# Patient Record
Sex: Female | Born: 1935
Health system: Southern US, Community
[De-identification: ages and names within clinical notes are randomized; demographics above are authoritative.]

---

## 2011-01-16 DEATH — deceased

## 2014-08-05 DIAGNOSIS — M75102 Unspecified rotator cuff tear or rupture of left shoulder, not specified as traumatic: Secondary | ICD-10-CM | POA: Diagnosis not present

## 2014-08-05 DIAGNOSIS — M25511 Pain in right shoulder: Secondary | ICD-10-CM | POA: Diagnosis not present

## 2014-08-05 DIAGNOSIS — M47812 Spondylosis without myelopathy or radiculopathy, cervical region: Secondary | ICD-10-CM | POA: Diagnosis not present

## 2014-08-12 DIAGNOSIS — M25512 Pain in left shoulder: Secondary | ICD-10-CM | POA: Diagnosis not present

## 2014-08-12 DIAGNOSIS — M75102 Unspecified rotator cuff tear or rupture of left shoulder, not specified as traumatic: Secondary | ICD-10-CM | POA: Diagnosis not present

## 2014-08-12 DIAGNOSIS — M25612 Stiffness of left shoulder, not elsewhere classified: Secondary | ICD-10-CM | POA: Diagnosis not present

## 2014-08-23 DIAGNOSIS — R0602 Shortness of breath: Secondary | ICD-10-CM | POA: Diagnosis not present

## 2014-08-23 DIAGNOSIS — Z886 Allergy status to analgesic agent status: Secondary | ICD-10-CM | POA: Diagnosis not present

## 2014-08-23 DIAGNOSIS — I129 Hypertensive chronic kidney disease with stage 1 through stage 4 chronic kidney disease, or unspecified chronic kidney disease: Secondary | ICD-10-CM | POA: Diagnosis not present

## 2014-08-23 DIAGNOSIS — R0789 Other chest pain: Secondary | ICD-10-CM | POA: Diagnosis not present

## 2014-08-23 DIAGNOSIS — E782 Mixed hyperlipidemia: Secondary | ICD-10-CM | POA: Diagnosis not present

## 2014-08-23 DIAGNOSIS — Z880 Allergy status to penicillin: Secondary | ICD-10-CM | POA: Diagnosis not present

## 2014-08-23 DIAGNOSIS — Z79899 Other long term (current) drug therapy: Secondary | ICD-10-CM | POA: Diagnosis not present

## 2014-08-23 DIAGNOSIS — E1142 Type 2 diabetes mellitus with diabetic polyneuropathy: Secondary | ICD-10-CM | POA: Diagnosis not present

## 2014-08-23 DIAGNOSIS — Z885 Allergy status to narcotic agent status: Secondary | ICD-10-CM | POA: Diagnosis not present

## 2014-08-23 DIAGNOSIS — K449 Diaphragmatic hernia without obstruction or gangrene: Secondary | ICD-10-CM | POA: Diagnosis not present

## 2014-08-23 DIAGNOSIS — Z888 Allergy status to other drugs, medicaments and biological substances status: Secondary | ICD-10-CM | POA: Diagnosis not present

## 2014-08-23 DIAGNOSIS — I2699 Other pulmonary embolism without acute cor pulmonale: Secondary | ICD-10-CM | POA: Diagnosis not present

## 2014-08-23 DIAGNOSIS — I10 Essential (primary) hypertension: Secondary | ICD-10-CM | POA: Diagnosis not present

## 2014-08-23 DIAGNOSIS — N183 Chronic kidney disease, stage 3 (moderate): Secondary | ICD-10-CM | POA: Diagnosis not present

## 2014-08-23 DIAGNOSIS — Z794 Long term (current) use of insulin: Secondary | ICD-10-CM | POA: Diagnosis not present

## 2014-08-23 DIAGNOSIS — E785 Hyperlipidemia, unspecified: Secondary | ICD-10-CM | POA: Diagnosis not present

## 2014-08-23 DIAGNOSIS — Z87891 Personal history of nicotine dependence: Secondary | ICD-10-CM | POA: Diagnosis not present

## 2014-08-24 DIAGNOSIS — I071 Rheumatic tricuspid insufficiency: Secondary | ICD-10-CM | POA: Diagnosis not present

## 2014-08-24 DIAGNOSIS — I5030 Unspecified diastolic (congestive) heart failure: Secondary | ICD-10-CM | POA: Diagnosis not present

## 2014-08-24 DIAGNOSIS — I517 Cardiomegaly: Secondary | ICD-10-CM | POA: Diagnosis not present

## 2014-08-24 DIAGNOSIS — I272 Other secondary pulmonary hypertension: Secondary | ICD-10-CM | POA: Diagnosis not present

## 2014-08-24 DIAGNOSIS — I82432 Acute embolism and thrombosis of left popliteal vein: Secondary | ICD-10-CM | POA: Diagnosis not present

## 2014-08-24 DIAGNOSIS — I2699 Other pulmonary embolism without acute cor pulmonale: Secondary | ICD-10-CM | POA: Diagnosis not present

## 2014-08-25 DIAGNOSIS — I2699 Other pulmonary embolism without acute cor pulmonale: Secondary | ICD-10-CM | POA: Diagnosis not present

## 2014-09-04 DIAGNOSIS — I10 Essential (primary) hypertension: Secondary | ICD-10-CM | POA: Diagnosis not present

## 2014-09-04 DIAGNOSIS — I2699 Other pulmonary embolism without acute cor pulmonale: Secondary | ICD-10-CM | POA: Diagnosis not present

## 2014-09-04 DIAGNOSIS — N183 Chronic kidney disease, stage 3 (moderate): Secondary | ICD-10-CM | POA: Diagnosis not present

## 2014-09-04 DIAGNOSIS — E559 Vitamin D deficiency, unspecified: Secondary | ICD-10-CM | POA: Diagnosis not present

## 2014-09-04 DIAGNOSIS — M8580 Other specified disorders of bone density and structure, unspecified site: Secondary | ICD-10-CM | POA: Diagnosis not present

## 2014-09-04 DIAGNOSIS — Z09 Encounter for follow-up examination after completed treatment for conditions other than malignant neoplasm: Secondary | ICD-10-CM | POA: Diagnosis not present

## 2014-09-04 DIAGNOSIS — I82432 Acute embolism and thrombosis of left popliteal vein: Secondary | ICD-10-CM | POA: Diagnosis not present

## 2014-09-04 DIAGNOSIS — E1122 Type 2 diabetes mellitus with diabetic chronic kidney disease: Secondary | ICD-10-CM | POA: Diagnosis not present

## 2014-09-29 DIAGNOSIS — E119 Type 2 diabetes mellitus without complications: Secondary | ICD-10-CM | POA: Diagnosis not present

## 2014-09-29 DIAGNOSIS — Z7901 Long term (current) use of anticoagulants: Secondary | ICD-10-CM | POA: Diagnosis not present

## 2014-09-29 DIAGNOSIS — I2699 Other pulmonary embolism without acute cor pulmonale: Secondary | ICD-10-CM | POA: Diagnosis not present

## 2014-09-29 DIAGNOSIS — Z794 Long term (current) use of insulin: Secondary | ICD-10-CM | POA: Diagnosis not present

## 2014-09-30 DIAGNOSIS — Z0100 Encounter for examination of eyes and vision without abnormal findings: Secondary | ICD-10-CM | POA: Diagnosis not present

## 2014-10-22 DIAGNOSIS — M47812 Spondylosis without myelopathy or radiculopathy, cervical region: Secondary | ICD-10-CM | POA: Diagnosis not present

## 2014-10-22 DIAGNOSIS — M25511 Pain in right shoulder: Secondary | ICD-10-CM | POA: Diagnosis not present

## 2014-10-22 DIAGNOSIS — M75102 Unspecified rotator cuff tear or rupture of left shoulder, not specified as traumatic: Secondary | ICD-10-CM | POA: Diagnosis not present

## 2014-10-27 DIAGNOSIS — M4802 Spinal stenosis, cervical region: Secondary | ICD-10-CM | POA: Diagnosis not present

## 2014-10-27 DIAGNOSIS — M47812 Spondylosis without myelopathy or radiculopathy, cervical region: Secondary | ICD-10-CM | POA: Diagnosis not present

## 2014-11-23 DIAGNOSIS — Z79899 Other long term (current) drug therapy: Secondary | ICD-10-CM | POA: Diagnosis not present

## 2014-11-23 DIAGNOSIS — Z7901 Long term (current) use of anticoagulants: Secondary | ICD-10-CM | POA: Diagnosis not present

## 2014-11-23 DIAGNOSIS — E1140 Type 2 diabetes mellitus with diabetic neuropathy, unspecified: Secondary | ICD-10-CM | POA: Diagnosis not present

## 2014-11-23 DIAGNOSIS — R0600 Dyspnea, unspecified: Secondary | ICD-10-CM | POA: Diagnosis not present

## 2014-11-23 DIAGNOSIS — Z87891 Personal history of nicotine dependence: Secondary | ICD-10-CM | POA: Diagnosis not present

## 2014-11-23 DIAGNOSIS — E870 Hyperosmolality and hypernatremia: Secondary | ICD-10-CM | POA: Diagnosis not present

## 2014-11-23 DIAGNOSIS — I2699 Other pulmonary embolism without acute cor pulmonale: Secondary | ICD-10-CM | POA: Diagnosis not present

## 2014-11-23 DIAGNOSIS — R748 Abnormal levels of other serum enzymes: Secondary | ICD-10-CM | POA: Diagnosis not present

## 2014-11-23 DIAGNOSIS — R112 Nausea with vomiting, unspecified: Secondary | ICD-10-CM | POA: Diagnosis not present

## 2014-11-23 DIAGNOSIS — I129 Hypertensive chronic kidney disease with stage 1 through stage 4 chronic kidney disease, or unspecified chronic kidney disease: Secondary | ICD-10-CM | POA: Diagnosis not present

## 2014-11-23 DIAGNOSIS — E782 Mixed hyperlipidemia: Secondary | ICD-10-CM | POA: Diagnosis not present

## 2014-11-23 DIAGNOSIS — R110 Nausea: Secondary | ICD-10-CM | POA: Diagnosis not present

## 2014-11-23 DIAGNOSIS — E780 Pure hypercholesterolemia: Secondary | ICD-10-CM | POA: Diagnosis not present

## 2014-11-23 DIAGNOSIS — Z86718 Personal history of other venous thrombosis and embolism: Secondary | ICD-10-CM | POA: Diagnosis not present

## 2014-11-23 DIAGNOSIS — R0789 Other chest pain: Secondary | ICD-10-CM | POA: Diagnosis not present

## 2014-11-23 DIAGNOSIS — E1122 Type 2 diabetes mellitus with diabetic chronic kidney disease: Secondary | ICD-10-CM | POA: Diagnosis not present

## 2014-11-23 DIAGNOSIS — K449 Diaphragmatic hernia without obstruction or gangrene: Secondary | ICD-10-CM | POA: Diagnosis not present

## 2014-11-23 DIAGNOSIS — E1169 Type 2 diabetes mellitus with other specified complication: Secondary | ICD-10-CM | POA: Diagnosis not present

## 2014-11-23 DIAGNOSIS — N183 Chronic kidney disease, stage 3 (moderate): Secondary | ICD-10-CM | POA: Diagnosis not present

## 2014-11-23 DIAGNOSIS — I10 Essential (primary) hypertension: Secondary | ICD-10-CM | POA: Diagnosis not present

## 2014-11-23 DIAGNOSIS — J156 Pneumonia due to other aerobic Gram-negative bacteria: Secondary | ICD-10-CM | POA: Diagnosis not present

## 2014-11-23 DIAGNOSIS — R42 Dizziness and giddiness: Secondary | ICD-10-CM | POA: Diagnosis not present

## 2014-11-23 DIAGNOSIS — E7211 Homocystinuria: Secondary | ICD-10-CM | POA: Diagnosis not present

## 2014-11-23 DIAGNOSIS — Z885 Allergy status to narcotic agent status: Secondary | ICD-10-CM | POA: Diagnosis not present

## 2014-11-23 DIAGNOSIS — Z880 Allergy status to penicillin: Secondary | ICD-10-CM | POA: Diagnosis not present

## 2014-11-23 DIAGNOSIS — R7989 Other specified abnormal findings of blood chemistry: Secondary | ICD-10-CM | POA: Diagnosis not present

## 2014-11-23 DIAGNOSIS — R079 Chest pain, unspecified: Secondary | ICD-10-CM | POA: Diagnosis not present

## 2014-11-23 DIAGNOSIS — F419 Anxiety disorder, unspecified: Secondary | ICD-10-CM | POA: Diagnosis not present

## 2014-11-23 DIAGNOSIS — Z888 Allergy status to other drugs, medicaments and biological substances status: Secondary | ICD-10-CM | POA: Diagnosis not present

## 2014-11-23 DIAGNOSIS — Z794 Long term (current) use of insulin: Secondary | ICD-10-CM | POA: Diagnosis not present

## 2014-11-23 DIAGNOSIS — Z886 Allergy status to analgesic agent status: Secondary | ICD-10-CM | POA: Diagnosis not present

## 2014-11-23 DIAGNOSIS — I2782 Chronic pulmonary embolism: Secondary | ICD-10-CM | POA: Diagnosis not present

## 2014-11-24 DIAGNOSIS — Z86718 Personal history of other venous thrombosis and embolism: Secondary | ICD-10-CM | POA: Diagnosis not present

## 2014-11-28 DIAGNOSIS — R079 Chest pain, unspecified: Secondary | ICD-10-CM | POA: Diagnosis not present

## 2014-11-28 DIAGNOSIS — E785 Hyperlipidemia, unspecified: Secondary | ICD-10-CM | POA: Diagnosis not present

## 2014-11-28 DIAGNOSIS — Z7902 Long term (current) use of antithrombotics/antiplatelets: Secondary | ICD-10-CM | POA: Diagnosis not present

## 2014-11-28 DIAGNOSIS — E1122 Type 2 diabetes mellitus with diabetic chronic kidney disease: Secondary | ICD-10-CM | POA: Diagnosis not present

## 2014-11-28 DIAGNOSIS — E1169 Type 2 diabetes mellitus with other specified complication: Secondary | ICD-10-CM | POA: Diagnosis not present

## 2014-11-28 DIAGNOSIS — E782 Mixed hyperlipidemia: Secondary | ICD-10-CM | POA: Diagnosis not present

## 2014-11-28 DIAGNOSIS — R931 Abnormal findings on diagnostic imaging of heart and coronary circulation: Secondary | ICD-10-CM | POA: Diagnosis not present

## 2014-11-28 DIAGNOSIS — I2510 Atherosclerotic heart disease of native coronary artery without angina pectoris: Secondary | ICD-10-CM | POA: Diagnosis not present

## 2014-11-28 DIAGNOSIS — Z87891 Personal history of nicotine dependence: Secondary | ICD-10-CM | POA: Diagnosis not present

## 2014-11-28 DIAGNOSIS — I129 Hypertensive chronic kidney disease with stage 1 through stage 4 chronic kidney disease, or unspecified chronic kidney disease: Secondary | ICD-10-CM | POA: Diagnosis not present

## 2014-11-28 DIAGNOSIS — Z794 Long term (current) use of insulin: Secondary | ICD-10-CM | POA: Diagnosis not present

## 2014-11-28 DIAGNOSIS — N183 Chronic kidney disease, stage 3 (moderate): Secondary | ICD-10-CM | POA: Diagnosis not present

## 2014-11-28 DIAGNOSIS — I10 Essential (primary) hypertension: Secondary | ICD-10-CM | POA: Diagnosis not present

## 2014-11-28 DIAGNOSIS — Z79899 Other long term (current) drug therapy: Secondary | ICD-10-CM | POA: Diagnosis not present

## 2014-11-28 DIAGNOSIS — E1142 Type 2 diabetes mellitus with diabetic polyneuropathy: Secondary | ICD-10-CM | POA: Diagnosis not present

## 2014-11-28 DIAGNOSIS — Z86711 Personal history of pulmonary embolism: Secondary | ICD-10-CM | POA: Diagnosis not present

## 2014-12-08 DIAGNOSIS — Z09 Encounter for follow-up examination after completed treatment for conditions other than malignant neoplasm: Secondary | ICD-10-CM | POA: Diagnosis not present

## 2014-12-08 DIAGNOSIS — Z955 Presence of coronary angioplasty implant and graft: Secondary | ICD-10-CM | POA: Diagnosis not present

## 2014-12-18 DIAGNOSIS — I2510 Atherosclerotic heart disease of native coronary artery without angina pectoris: Secondary | ICD-10-CM | POA: Diagnosis not present

## 2014-12-18 DIAGNOSIS — I2699 Other pulmonary embolism without acute cor pulmonale: Secondary | ICD-10-CM | POA: Diagnosis not present

## 2014-12-18 DIAGNOSIS — Z955 Presence of coronary angioplasty implant and graft: Secondary | ICD-10-CM | POA: Diagnosis not present

## 2014-12-18 DIAGNOSIS — I10 Essential (primary) hypertension: Secondary | ICD-10-CM | POA: Diagnosis not present

## 2014-12-22 DIAGNOSIS — I82432 Acute embolism and thrombosis of left popliteal vein: Secondary | ICD-10-CM | POA: Diagnosis not present

## 2014-12-22 DIAGNOSIS — E559 Vitamin D deficiency, unspecified: Secondary | ICD-10-CM | POA: Diagnosis not present

## 2014-12-22 DIAGNOSIS — I10 Essential (primary) hypertension: Secondary | ICD-10-CM | POA: Diagnosis not present

## 2014-12-22 DIAGNOSIS — E1122 Type 2 diabetes mellitus with diabetic chronic kidney disease: Secondary | ICD-10-CM | POA: Diagnosis not present

## 2014-12-22 DIAGNOSIS — M8580 Other specified disorders of bone density and structure, unspecified site: Secondary | ICD-10-CM | POA: Diagnosis not present

## 2015-01-07 DIAGNOSIS — Z885 Allergy status to narcotic agent status: Secondary | ICD-10-CM | POA: Diagnosis not present

## 2015-01-07 DIAGNOSIS — S40011A Contusion of right shoulder, initial encounter: Secondary | ICD-10-CM | POA: Diagnosis not present

## 2015-01-07 DIAGNOSIS — Z886 Allergy status to analgesic agent status: Secondary | ICD-10-CM | POA: Diagnosis not present

## 2015-01-07 DIAGNOSIS — S8991XA Unspecified injury of right lower leg, initial encounter: Secondary | ICD-10-CM | POA: Diagnosis not present

## 2015-01-07 DIAGNOSIS — E162 Hypoglycemia, unspecified: Secondary | ICD-10-CM | POA: Diagnosis not present

## 2015-01-07 DIAGNOSIS — Z7982 Long term (current) use of aspirin: Secondary | ICD-10-CM | POA: Diagnosis not present

## 2015-01-07 DIAGNOSIS — Z7901 Long term (current) use of anticoagulants: Secondary | ICD-10-CM | POA: Diagnosis not present

## 2015-01-07 DIAGNOSIS — M7730 Calcaneal spur, unspecified foot: Secondary | ICD-10-CM | POA: Diagnosis not present

## 2015-01-07 DIAGNOSIS — M19011 Primary osteoarthritis, right shoulder: Secondary | ICD-10-CM | POA: Diagnosis not present

## 2015-01-07 DIAGNOSIS — Z888 Allergy status to other drugs, medicaments and biological substances status: Secondary | ICD-10-CM | POA: Diagnosis not present

## 2015-01-07 DIAGNOSIS — I10 Essential (primary) hypertension: Secondary | ICD-10-CM | POA: Diagnosis not present

## 2015-01-07 DIAGNOSIS — R531 Weakness: Secondary | ICD-10-CM | POA: Diagnosis not present

## 2015-01-07 DIAGNOSIS — Z86711 Personal history of pulmonary embolism: Secondary | ICD-10-CM | POA: Diagnosis not present

## 2015-01-07 DIAGNOSIS — Z880 Allergy status to penicillin: Secondary | ICD-10-CM | POA: Diagnosis not present

## 2015-01-07 DIAGNOSIS — E11649 Type 2 diabetes mellitus with hypoglycemia without coma: Secondary | ICD-10-CM | POA: Diagnosis not present

## 2015-01-07 DIAGNOSIS — R55 Syncope and collapse: Secondary | ICD-10-CM | POA: Diagnosis not present

## 2015-01-07 DIAGNOSIS — W1789XA Other fall from one level to another, initial encounter: Secondary | ICD-10-CM | POA: Diagnosis not present

## 2015-01-07 DIAGNOSIS — I517 Cardiomegaly: Secondary | ICD-10-CM | POA: Diagnosis not present

## 2015-01-07 DIAGNOSIS — S79911A Unspecified injury of right hip, initial encounter: Secondary | ICD-10-CM | POA: Diagnosis not present

## 2015-01-07 DIAGNOSIS — S79921A Unspecified injury of right thigh, initial encounter: Secondary | ICD-10-CM | POA: Diagnosis not present

## 2015-01-07 DIAGNOSIS — S4991XA Unspecified injury of right shoulder and upper arm, initial encounter: Secondary | ICD-10-CM | POA: Diagnosis not present

## 2015-01-07 DIAGNOSIS — S40021A Contusion of right upper arm, initial encounter: Secondary | ICD-10-CM | POA: Diagnosis not present

## 2015-01-07 DIAGNOSIS — Z794 Long term (current) use of insulin: Secondary | ICD-10-CM | POA: Diagnosis not present

## 2015-01-07 DIAGNOSIS — E780 Pure hypercholesterolemia: Secondary | ICD-10-CM | POA: Diagnosis not present

## 2015-01-07 DIAGNOSIS — Z87891 Personal history of nicotine dependence: Secondary | ICD-10-CM | POA: Diagnosis not present

## 2015-01-07 DIAGNOSIS — G629 Polyneuropathy, unspecified: Secondary | ICD-10-CM | POA: Diagnosis not present

## 2015-01-07 DIAGNOSIS — R7309 Other abnormal glucose: Secondary | ICD-10-CM | POA: Diagnosis not present

## 2015-01-07 DIAGNOSIS — E161 Other hypoglycemia: Secondary | ICD-10-CM | POA: Diagnosis not present

## 2015-01-07 DIAGNOSIS — Z79899 Other long term (current) drug therapy: Secondary | ICD-10-CM | POA: Diagnosis not present

## 2015-04-02 DIAGNOSIS — H40012 Open angle with borderline findings, low risk, left eye: Secondary | ICD-10-CM | POA: Diagnosis not present

## 2015-04-14 DIAGNOSIS — K5900 Constipation, unspecified: Secondary | ICD-10-CM | POA: Diagnosis not present

## 2015-04-22 DIAGNOSIS — I10 Essential (primary) hypertension: Secondary | ICD-10-CM | POA: Diagnosis not present

## 2015-04-22 DIAGNOSIS — E785 Hyperlipidemia, unspecified: Secondary | ICD-10-CM | POA: Diagnosis not present

## 2015-04-22 DIAGNOSIS — N183 Chronic kidney disease, stage 3 (moderate): Secondary | ICD-10-CM | POA: Diagnosis not present

## 2015-04-22 DIAGNOSIS — E1122 Type 2 diabetes mellitus with diabetic chronic kidney disease: Secondary | ICD-10-CM | POA: Diagnosis not present

## 2015-04-22 DIAGNOSIS — Z23 Encounter for immunization: Secondary | ICD-10-CM | POA: Diagnosis not present

## 2015-04-22 DIAGNOSIS — E1165 Type 2 diabetes mellitus with hyperglycemia: Secondary | ICD-10-CM | POA: Diagnosis not present

## 2015-04-28 DIAGNOSIS — Z86711 Personal history of pulmonary embolism: Secondary | ICD-10-CM | POA: Diagnosis not present

## 2015-04-28 DIAGNOSIS — I2699 Other pulmonary embolism without acute cor pulmonale: Secondary | ICD-10-CM | POA: Diagnosis not present

## 2015-04-28 DIAGNOSIS — Z7901 Long term (current) use of anticoagulants: Secondary | ICD-10-CM | POA: Diagnosis not present

## 2015-04-30 DIAGNOSIS — Z1231 Encounter for screening mammogram for malignant neoplasm of breast: Secondary | ICD-10-CM | POA: Diagnosis not present

## 2015-06-08 DIAGNOSIS — H40029 Open angle with borderline findings, high risk, unspecified eye: Secondary | ICD-10-CM | POA: Diagnosis not present

## 2015-06-12 DIAGNOSIS — M25619 Stiffness of unspecified shoulder, not elsewhere classified: Secondary | ICD-10-CM | POA: Diagnosis not present

## 2015-06-12 DIAGNOSIS — M25512 Pain in left shoulder: Secondary | ICD-10-CM | POA: Diagnosis not present

## 2015-06-12 DIAGNOSIS — M542 Cervicalgia: Secondary | ICD-10-CM | POA: Diagnosis not present

## 2015-07-02 DIAGNOSIS — M899 Disorder of bone, unspecified: Secondary | ICD-10-CM | POA: Diagnosis not present

## 2015-07-02 DIAGNOSIS — R5383 Other fatigue: Secondary | ICD-10-CM | POA: Diagnosis not present

## 2015-07-02 DIAGNOSIS — E785 Hyperlipidemia, unspecified: Secondary | ICD-10-CM | POA: Diagnosis not present

## 2015-07-02 DIAGNOSIS — E1122 Type 2 diabetes mellitus with diabetic chronic kidney disease: Secondary | ICD-10-CM | POA: Diagnosis not present

## 2015-07-02 DIAGNOSIS — Z0000 Encounter for general adult medical examination without abnormal findings: Secondary | ICD-10-CM | POA: Diagnosis not present

## 2015-07-02 DIAGNOSIS — M2550 Pain in unspecified joint: Secondary | ICD-10-CM | POA: Diagnosis not present

## 2015-07-02 DIAGNOSIS — Z23 Encounter for immunization: Secondary | ICD-10-CM | POA: Diagnosis not present

## 2015-07-02 DIAGNOSIS — I10 Essential (primary) hypertension: Secondary | ICD-10-CM | POA: Diagnosis not present

## 2015-07-02 DIAGNOSIS — E559 Vitamin D deficiency, unspecified: Secondary | ICD-10-CM | POA: Diagnosis not present

## 2015-07-02 DIAGNOSIS — Z1289 Encounter for screening for malignant neoplasm of other sites: Secondary | ICD-10-CM | POA: Diagnosis not present

## 2015-07-02 DIAGNOSIS — Z8669 Personal history of other diseases of the nervous system and sense organs: Secondary | ICD-10-CM | POA: Diagnosis not present

## 2015-07-02 DIAGNOSIS — N183 Chronic kidney disease, stage 3 (moderate): Secondary | ICD-10-CM | POA: Diagnosis not present

## 2015-07-02 DIAGNOSIS — M8580 Other specified disorders of bone density and structure, unspecified site: Secondary | ICD-10-CM | POA: Diagnosis not present

## 2015-08-06 DIAGNOSIS — I10 Essential (primary) hypertension: Secondary | ICD-10-CM | POA: Diagnosis not present

## 2015-08-06 DIAGNOSIS — I2699 Other pulmonary embolism without acute cor pulmonale: Secondary | ICD-10-CM | POA: Diagnosis not present

## 2015-08-06 DIAGNOSIS — Z955 Presence of coronary angioplasty implant and graft: Secondary | ICD-10-CM | POA: Diagnosis not present

## 2015-08-06 DIAGNOSIS — I2510 Atherosclerotic heart disease of native coronary artery without angina pectoris: Secondary | ICD-10-CM | POA: Diagnosis not present

## 2015-08-13 DIAGNOSIS — M79672 Pain in left foot: Secondary | ICD-10-CM | POA: Diagnosis not present

## 2015-08-13 DIAGNOSIS — M79671 Pain in right foot: Secondary | ICD-10-CM | POA: Diagnosis not present

## 2015-08-20 DIAGNOSIS — G629 Polyneuropathy, unspecified: Secondary | ICD-10-CM | POA: Diagnosis not present

## 2015-08-20 DIAGNOSIS — R208 Other disturbances of skin sensation: Secondary | ICD-10-CM | POA: Diagnosis not present

## 2015-09-24 DIAGNOSIS — E531 Pyridoxine deficiency: Secondary | ICD-10-CM | POA: Diagnosis not present

## 2015-09-24 DIAGNOSIS — E1142 Type 2 diabetes mellitus with diabetic polyneuropathy: Secondary | ICD-10-CM | POA: Diagnosis not present

## 2015-09-24 DIAGNOSIS — E538 Deficiency of other specified B group vitamins: Secondary | ICD-10-CM | POA: Diagnosis not present

## 2015-09-24 DIAGNOSIS — Z79899 Other long term (current) drug therapy: Secondary | ICD-10-CM | POA: Diagnosis not present

## 2015-09-24 DIAGNOSIS — G609 Hereditary and idiopathic neuropathy, unspecified: Secondary | ICD-10-CM | POA: Diagnosis not present

## 2015-09-24 DIAGNOSIS — Z86718 Personal history of other venous thrombosis and embolism: Secondary | ICD-10-CM | POA: Diagnosis not present

## 2015-10-15 DIAGNOSIS — R110 Nausea: Secondary | ICD-10-CM | POA: Diagnosis not present

## 2015-10-15 DIAGNOSIS — I2510 Atherosclerotic heart disease of native coronary artery without angina pectoris: Secondary | ICD-10-CM | POA: Diagnosis not present

## 2015-10-15 DIAGNOSIS — Z87891 Personal history of nicotine dependence: Secondary | ICD-10-CM | POA: Diagnosis not present

## 2015-10-15 DIAGNOSIS — R0989 Other specified symptoms and signs involving the circulatory and respiratory systems: Secondary | ICD-10-CM | POA: Diagnosis not present

## 2015-10-15 DIAGNOSIS — Z888 Allergy status to other drugs, medicaments and biological substances status: Secondary | ICD-10-CM | POA: Diagnosis not present

## 2015-10-15 DIAGNOSIS — R279 Unspecified lack of coordination: Secondary | ICD-10-CM | POA: Diagnosis not present

## 2015-10-15 DIAGNOSIS — Z885 Allergy status to narcotic agent status: Secondary | ICD-10-CM | POA: Diagnosis not present

## 2015-10-15 DIAGNOSIS — I129 Hypertensive chronic kidney disease with stage 1 through stage 4 chronic kidney disease, or unspecified chronic kidney disease: Secondary | ICD-10-CM | POA: Diagnosis not present

## 2015-10-15 DIAGNOSIS — R05 Cough: Secondary | ICD-10-CM | POA: Diagnosis not present

## 2015-10-15 DIAGNOSIS — M79604 Pain in right leg: Secondary | ICD-10-CM | POA: Diagnosis not present

## 2015-10-15 DIAGNOSIS — Z955 Presence of coronary angioplasty implant and graft: Secondary | ICD-10-CM | POA: Diagnosis not present

## 2015-10-15 DIAGNOSIS — Z86711 Personal history of pulmonary embolism: Secondary | ICD-10-CM | POA: Diagnosis not present

## 2015-10-15 DIAGNOSIS — I5189 Other ill-defined heart diseases: Secondary | ICD-10-CM | POA: Diagnosis not present

## 2015-10-15 DIAGNOSIS — R52 Pain, unspecified: Secondary | ICD-10-CM | POA: Diagnosis not present

## 2015-10-15 DIAGNOSIS — M79605 Pain in left leg: Secondary | ICD-10-CM | POA: Diagnosis not present

## 2015-10-15 DIAGNOSIS — N183 Chronic kidney disease, stage 3 (moderate): Secondary | ICD-10-CM | POA: Diagnosis not present

## 2015-10-15 DIAGNOSIS — R2681 Unsteadiness on feet: Secondary | ICD-10-CM | POA: Diagnosis not present

## 2015-10-15 DIAGNOSIS — I160 Hypertensive urgency: Secondary | ICD-10-CM | POA: Diagnosis not present

## 2015-10-15 DIAGNOSIS — E11649 Type 2 diabetes mellitus with hypoglycemia without coma: Secondary | ICD-10-CM | POA: Diagnosis not present

## 2015-10-15 DIAGNOSIS — Z9111 Patient's noncompliance with dietary regimen: Secondary | ICD-10-CM | POA: Diagnosis not present

## 2015-10-15 DIAGNOSIS — M7989 Other specified soft tissue disorders: Secondary | ICD-10-CM | POA: Diagnosis not present

## 2015-10-15 DIAGNOSIS — E8779 Other fluid overload: Secondary | ICD-10-CM | POA: Diagnosis not present

## 2015-10-15 DIAGNOSIS — E7800 Pure hypercholesterolemia, unspecified: Secondary | ICD-10-CM | POA: Diagnosis not present

## 2015-10-15 DIAGNOSIS — Z7901 Long term (current) use of anticoagulants: Secondary | ICD-10-CM | POA: Diagnosis not present

## 2015-10-15 DIAGNOSIS — E1142 Type 2 diabetes mellitus with diabetic polyneuropathy: Secondary | ICD-10-CM | POA: Diagnosis not present

## 2015-10-15 DIAGNOSIS — M79606 Pain in leg, unspecified: Secondary | ICD-10-CM | POA: Diagnosis not present

## 2015-10-15 DIAGNOSIS — Z880 Allergy status to penicillin: Secondary | ICD-10-CM | POA: Diagnosis not present

## 2015-10-15 DIAGNOSIS — Z886 Allergy status to analgesic agent status: Secondary | ICD-10-CM | POA: Diagnosis not present

## 2015-10-15 DIAGNOSIS — I272 Other secondary pulmonary hypertension: Secondary | ICD-10-CM | POA: Diagnosis not present

## 2015-10-15 DIAGNOSIS — I270 Primary pulmonary hypertension: Secondary | ICD-10-CM | POA: Diagnosis not present

## 2015-10-15 DIAGNOSIS — E782 Mixed hyperlipidemia: Secondary | ICD-10-CM | POA: Diagnosis not present

## 2015-10-15 DIAGNOSIS — E870 Hyperosmolality and hypernatremia: Secondary | ICD-10-CM | POA: Diagnosis not present

## 2015-10-15 DIAGNOSIS — N179 Acute kidney failure, unspecified: Secondary | ICD-10-CM | POA: Diagnosis not present

## 2015-10-15 DIAGNOSIS — M6281 Muscle weakness (generalized): Secondary | ICD-10-CM | POA: Diagnosis not present

## 2015-10-15 DIAGNOSIS — R270 Ataxia, unspecified: Secondary | ICD-10-CM | POA: Diagnosis not present

## 2015-10-15 DIAGNOSIS — E8770 Fluid overload, unspecified: Secondary | ICD-10-CM | POA: Diagnosis not present

## 2015-10-15 DIAGNOSIS — R531 Weakness: Secondary | ICD-10-CM | POA: Diagnosis not present

## 2015-10-15 DIAGNOSIS — R918 Other nonspecific abnormal finding of lung field: Secondary | ICD-10-CM | POA: Diagnosis not present

## 2015-10-15 DIAGNOSIS — E1122 Type 2 diabetes mellitus with diabetic chronic kidney disease: Secondary | ICD-10-CM | POA: Diagnosis not present

## 2015-10-15 DIAGNOSIS — I2699 Other pulmonary embolism without acute cor pulmonale: Secondary | ICD-10-CM | POA: Diagnosis not present

## 2015-10-20 DIAGNOSIS — R2681 Unsteadiness on feet: Secondary | ICD-10-CM | POA: Diagnosis not present

## 2015-10-20 DIAGNOSIS — E1142 Type 2 diabetes mellitus with diabetic polyneuropathy: Secondary | ICD-10-CM | POA: Diagnosis not present

## 2015-10-20 DIAGNOSIS — M79604 Pain in right leg: Secondary | ICD-10-CM | POA: Diagnosis not present

## 2015-10-20 DIAGNOSIS — E782 Mixed hyperlipidemia: Secondary | ICD-10-CM | POA: Diagnosis not present

## 2015-10-20 DIAGNOSIS — I272 Other secondary pulmonary hypertension: Secondary | ICD-10-CM | POA: Diagnosis not present

## 2015-10-20 DIAGNOSIS — M6281 Muscle weakness (generalized): Secondary | ICD-10-CM | POA: Diagnosis not present

## 2015-10-20 DIAGNOSIS — E119 Type 2 diabetes mellitus without complications: Secondary | ICD-10-CM | POA: Diagnosis not present

## 2015-10-20 DIAGNOSIS — I2699 Other pulmonary embolism without acute cor pulmonale: Secondary | ICD-10-CM | POA: Diagnosis not present

## 2015-10-20 DIAGNOSIS — I160 Hypertensive urgency: Secondary | ICD-10-CM | POA: Diagnosis not present

## 2015-10-20 DIAGNOSIS — R279 Unspecified lack of coordination: Secondary | ICD-10-CM | POA: Diagnosis not present

## 2015-10-20 DIAGNOSIS — E870 Hyperosmolality and hypernatremia: Secondary | ICD-10-CM | POA: Diagnosis not present

## 2015-10-20 DIAGNOSIS — I270 Primary pulmonary hypertension: Secondary | ICD-10-CM | POA: Diagnosis not present

## 2015-10-20 DIAGNOSIS — E8770 Fluid overload, unspecified: Secondary | ICD-10-CM | POA: Diagnosis not present

## 2015-10-20 DIAGNOSIS — E1122 Type 2 diabetes mellitus with diabetic chronic kidney disease: Secondary | ICD-10-CM | POA: Diagnosis not present

## 2015-10-26 DIAGNOSIS — E782 Mixed hyperlipidemia: Secondary | ICD-10-CM | POA: Diagnosis not present

## 2015-10-26 DIAGNOSIS — E119 Type 2 diabetes mellitus without complications: Secondary | ICD-10-CM | POA: Diagnosis not present

## 2015-10-26 DIAGNOSIS — I2699 Other pulmonary embolism without acute cor pulmonale: Secondary | ICD-10-CM | POA: Diagnosis not present

## 2015-10-26 DIAGNOSIS — I270 Primary pulmonary hypertension: Secondary | ICD-10-CM | POA: Diagnosis not present

## 2015-10-26 DIAGNOSIS — I160 Hypertensive urgency: Secondary | ICD-10-CM | POA: Diagnosis not present

## 2015-11-08 DIAGNOSIS — Z794 Long term (current) use of insulin: Secondary | ICD-10-CM | POA: Diagnosis not present

## 2015-11-08 DIAGNOSIS — E782 Mixed hyperlipidemia: Secondary | ICD-10-CM | POA: Diagnosis not present

## 2015-11-08 DIAGNOSIS — R262 Difficulty in walking, not elsewhere classified: Secondary | ICD-10-CM | POA: Diagnosis not present

## 2015-11-08 DIAGNOSIS — E1142 Type 2 diabetes mellitus with diabetic polyneuropathy: Secondary | ICD-10-CM | POA: Diagnosis not present

## 2015-11-08 DIAGNOSIS — M6281 Muscle weakness (generalized): Secondary | ICD-10-CM | POA: Diagnosis not present

## 2015-11-08 DIAGNOSIS — Z86711 Personal history of pulmonary embolism: Secondary | ICD-10-CM | POA: Diagnosis not present

## 2015-11-08 DIAGNOSIS — R2681 Unsteadiness on feet: Secondary | ICD-10-CM | POA: Diagnosis not present

## 2015-11-08 DIAGNOSIS — Z7901 Long term (current) use of anticoagulants: Secondary | ICD-10-CM | POA: Diagnosis not present

## 2015-11-08 DIAGNOSIS — N183 Chronic kidney disease, stage 3 (moderate): Secondary | ICD-10-CM | POA: Diagnosis not present

## 2015-11-08 DIAGNOSIS — Z7982 Long term (current) use of aspirin: Secondary | ICD-10-CM | POA: Diagnosis not present

## 2015-11-08 DIAGNOSIS — E1122 Type 2 diabetes mellitus with diabetic chronic kidney disease: Secondary | ICD-10-CM | POA: Diagnosis not present

## 2015-11-08 DIAGNOSIS — I270 Primary pulmonary hypertension: Secondary | ICD-10-CM | POA: Diagnosis not present

## 2015-11-09 DIAGNOSIS — E1122 Type 2 diabetes mellitus with diabetic chronic kidney disease: Secondary | ICD-10-CM | POA: Diagnosis not present

## 2015-11-09 DIAGNOSIS — Z86711 Personal history of pulmonary embolism: Secondary | ICD-10-CM | POA: Diagnosis not present

## 2015-11-09 DIAGNOSIS — Z7901 Long term (current) use of anticoagulants: Secondary | ICD-10-CM | POA: Diagnosis not present

## 2015-11-09 DIAGNOSIS — N183 Chronic kidney disease, stage 3 (moderate): Secondary | ICD-10-CM | POA: Diagnosis not present

## 2015-11-09 DIAGNOSIS — R262 Difficulty in walking, not elsewhere classified: Secondary | ICD-10-CM | POA: Diagnosis not present

## 2015-11-09 DIAGNOSIS — E782 Mixed hyperlipidemia: Secondary | ICD-10-CM | POA: Diagnosis not present

## 2015-11-09 DIAGNOSIS — Z794 Long term (current) use of insulin: Secondary | ICD-10-CM | POA: Diagnosis not present

## 2015-11-09 DIAGNOSIS — I270 Primary pulmonary hypertension: Secondary | ICD-10-CM | POA: Diagnosis not present

## 2015-11-09 DIAGNOSIS — M6281 Muscle weakness (generalized): Secondary | ICD-10-CM | POA: Diagnosis not present

## 2015-11-09 DIAGNOSIS — Z7982 Long term (current) use of aspirin: Secondary | ICD-10-CM | POA: Diagnosis not present

## 2015-11-09 DIAGNOSIS — E1142 Type 2 diabetes mellitus with diabetic polyneuropathy: Secondary | ICD-10-CM | POA: Diagnosis not present

## 2015-11-09 DIAGNOSIS — R2681 Unsteadiness on feet: Secondary | ICD-10-CM | POA: Diagnosis not present

## 2015-11-10 DIAGNOSIS — R2681 Unsteadiness on feet: Secondary | ICD-10-CM | POA: Diagnosis not present

## 2015-11-10 DIAGNOSIS — R262 Difficulty in walking, not elsewhere classified: Secondary | ICD-10-CM | POA: Diagnosis not present

## 2015-11-10 DIAGNOSIS — Z7982 Long term (current) use of aspirin: Secondary | ICD-10-CM | POA: Diagnosis not present

## 2015-11-10 DIAGNOSIS — E1122 Type 2 diabetes mellitus with diabetic chronic kidney disease: Secondary | ICD-10-CM | POA: Diagnosis not present

## 2015-11-10 DIAGNOSIS — E782 Mixed hyperlipidemia: Secondary | ICD-10-CM | POA: Diagnosis not present

## 2015-11-10 DIAGNOSIS — N183 Chronic kidney disease, stage 3 (moderate): Secondary | ICD-10-CM | POA: Diagnosis not present

## 2015-11-10 DIAGNOSIS — M6281 Muscle weakness (generalized): Secondary | ICD-10-CM | POA: Diagnosis not present

## 2015-11-10 DIAGNOSIS — Z7901 Long term (current) use of anticoagulants: Secondary | ICD-10-CM | POA: Diagnosis not present

## 2015-11-10 DIAGNOSIS — I270 Primary pulmonary hypertension: Secondary | ICD-10-CM | POA: Diagnosis not present

## 2015-11-10 DIAGNOSIS — E1142 Type 2 diabetes mellitus with diabetic polyneuropathy: Secondary | ICD-10-CM | POA: Diagnosis not present

## 2015-11-10 DIAGNOSIS — Z794 Long term (current) use of insulin: Secondary | ICD-10-CM | POA: Diagnosis not present

## 2015-11-10 DIAGNOSIS — Z86711 Personal history of pulmonary embolism: Secondary | ICD-10-CM | POA: Diagnosis not present

## 2015-11-11 DIAGNOSIS — Z7982 Long term (current) use of aspirin: Secondary | ICD-10-CM | POA: Diagnosis not present

## 2015-11-11 DIAGNOSIS — R2681 Unsteadiness on feet: Secondary | ICD-10-CM | POA: Diagnosis not present

## 2015-11-11 DIAGNOSIS — N183 Chronic kidney disease, stage 3 (moderate): Secondary | ICD-10-CM | POA: Diagnosis not present

## 2015-11-11 DIAGNOSIS — E782 Mixed hyperlipidemia: Secondary | ICD-10-CM | POA: Diagnosis not present

## 2015-11-11 DIAGNOSIS — I270 Primary pulmonary hypertension: Secondary | ICD-10-CM | POA: Diagnosis not present

## 2015-11-11 DIAGNOSIS — E1122 Type 2 diabetes mellitus with diabetic chronic kidney disease: Secondary | ICD-10-CM | POA: Diagnosis not present

## 2015-11-11 DIAGNOSIS — Z794 Long term (current) use of insulin: Secondary | ICD-10-CM | POA: Diagnosis not present

## 2015-11-11 DIAGNOSIS — M6281 Muscle weakness (generalized): Secondary | ICD-10-CM | POA: Diagnosis not present

## 2015-11-11 DIAGNOSIS — R262 Difficulty in walking, not elsewhere classified: Secondary | ICD-10-CM | POA: Diagnosis not present

## 2015-11-11 DIAGNOSIS — Z7901 Long term (current) use of anticoagulants: Secondary | ICD-10-CM | POA: Diagnosis not present

## 2015-11-11 DIAGNOSIS — E1142 Type 2 diabetes mellitus with diabetic polyneuropathy: Secondary | ICD-10-CM | POA: Diagnosis not present

## 2015-11-11 DIAGNOSIS — Z86711 Personal history of pulmonary embolism: Secondary | ICD-10-CM | POA: Diagnosis not present

## 2015-11-17 DIAGNOSIS — H40013 Open angle with borderline findings, low risk, bilateral: Secondary | ICD-10-CM | POA: Diagnosis not present

## 2015-11-18 DIAGNOSIS — E782 Mixed hyperlipidemia: Secondary | ICD-10-CM | POA: Diagnosis not present

## 2015-11-18 DIAGNOSIS — M6281 Muscle weakness (generalized): Secondary | ICD-10-CM | POA: Diagnosis not present

## 2015-11-18 DIAGNOSIS — E1122 Type 2 diabetes mellitus with diabetic chronic kidney disease: Secondary | ICD-10-CM | POA: Diagnosis not present

## 2015-11-18 DIAGNOSIS — I270 Primary pulmonary hypertension: Secondary | ICD-10-CM | POA: Diagnosis not present

## 2015-11-18 DIAGNOSIS — R2681 Unsteadiness on feet: Secondary | ICD-10-CM | POA: Diagnosis not present

## 2015-11-18 DIAGNOSIS — N183 Chronic kidney disease, stage 3 (moderate): Secondary | ICD-10-CM | POA: Diagnosis not present

## 2015-11-18 DIAGNOSIS — Z86711 Personal history of pulmonary embolism: Secondary | ICD-10-CM | POA: Diagnosis not present

## 2015-11-18 DIAGNOSIS — E1142 Type 2 diabetes mellitus with diabetic polyneuropathy: Secondary | ICD-10-CM | POA: Diagnosis not present

## 2015-11-18 DIAGNOSIS — R262 Difficulty in walking, not elsewhere classified: Secondary | ICD-10-CM | POA: Diagnosis not present

## 2015-11-18 DIAGNOSIS — Z794 Long term (current) use of insulin: Secondary | ICD-10-CM | POA: Diagnosis not present

## 2015-11-18 DIAGNOSIS — Z7982 Long term (current) use of aspirin: Secondary | ICD-10-CM | POA: Diagnosis not present

## 2015-11-18 DIAGNOSIS — Z7901 Long term (current) use of anticoagulants: Secondary | ICD-10-CM | POA: Diagnosis not present

## 2015-11-24 DIAGNOSIS — E1122 Type 2 diabetes mellitus with diabetic chronic kidney disease: Secondary | ICD-10-CM | POA: Diagnosis not present

## 2015-11-24 DIAGNOSIS — Z09 Encounter for follow-up examination after completed treatment for conditions other than malignant neoplasm: Secondary | ICD-10-CM | POA: Diagnosis not present

## 2015-11-24 DIAGNOSIS — E785 Hyperlipidemia, unspecified: Secondary | ICD-10-CM | POA: Diagnosis not present

## 2015-11-24 DIAGNOSIS — E1140 Type 2 diabetes mellitus with diabetic neuropathy, unspecified: Secondary | ICD-10-CM | POA: Diagnosis not present

## 2015-11-24 DIAGNOSIS — I10 Essential (primary) hypertension: Secondary | ICD-10-CM | POA: Diagnosis not present

## 2015-11-25 DIAGNOSIS — E1142 Type 2 diabetes mellitus with diabetic polyneuropathy: Secondary | ICD-10-CM | POA: Diagnosis not present

## 2015-11-25 DIAGNOSIS — R262 Difficulty in walking, not elsewhere classified: Secondary | ICD-10-CM | POA: Diagnosis not present

## 2015-11-25 DIAGNOSIS — M6281 Muscle weakness (generalized): Secondary | ICD-10-CM | POA: Diagnosis not present

## 2015-11-25 DIAGNOSIS — N183 Chronic kidney disease, stage 3 (moderate): Secondary | ICD-10-CM | POA: Diagnosis not present

## 2015-11-25 DIAGNOSIS — I270 Primary pulmonary hypertension: Secondary | ICD-10-CM | POA: Diagnosis not present

## 2015-11-25 DIAGNOSIS — R2681 Unsteadiness on feet: Secondary | ICD-10-CM | POA: Diagnosis not present

## 2015-11-25 DIAGNOSIS — Z86711 Personal history of pulmonary embolism: Secondary | ICD-10-CM | POA: Diagnosis not present

## 2015-11-25 DIAGNOSIS — Z7901 Long term (current) use of anticoagulants: Secondary | ICD-10-CM | POA: Diagnosis not present

## 2015-11-25 DIAGNOSIS — E1122 Type 2 diabetes mellitus with diabetic chronic kidney disease: Secondary | ICD-10-CM | POA: Diagnosis not present

## 2015-11-25 DIAGNOSIS — Z7982 Long term (current) use of aspirin: Secondary | ICD-10-CM | POA: Diagnosis not present

## 2015-11-25 DIAGNOSIS — Z794 Long term (current) use of insulin: Secondary | ICD-10-CM | POA: Diagnosis not present

## 2015-11-25 DIAGNOSIS — E782 Mixed hyperlipidemia: Secondary | ICD-10-CM | POA: Diagnosis not present

## 2015-11-26 DIAGNOSIS — E782 Mixed hyperlipidemia: Secondary | ICD-10-CM | POA: Diagnosis not present

## 2015-11-26 DIAGNOSIS — N183 Chronic kidney disease, stage 3 (moderate): Secondary | ICD-10-CM | POA: Diagnosis not present

## 2015-11-26 DIAGNOSIS — E1142 Type 2 diabetes mellitus with diabetic polyneuropathy: Secondary | ICD-10-CM | POA: Diagnosis not present

## 2015-11-26 DIAGNOSIS — I270 Primary pulmonary hypertension: Secondary | ICD-10-CM | POA: Diagnosis not present

## 2015-11-26 DIAGNOSIS — Z7901 Long term (current) use of anticoagulants: Secondary | ICD-10-CM | POA: Diagnosis not present

## 2015-11-26 DIAGNOSIS — R2681 Unsteadiness on feet: Secondary | ICD-10-CM | POA: Diagnosis not present

## 2015-11-26 DIAGNOSIS — M6281 Muscle weakness (generalized): Secondary | ICD-10-CM | POA: Diagnosis not present

## 2015-11-26 DIAGNOSIS — R262 Difficulty in walking, not elsewhere classified: Secondary | ICD-10-CM | POA: Diagnosis not present

## 2015-11-26 DIAGNOSIS — E1122 Type 2 diabetes mellitus with diabetic chronic kidney disease: Secondary | ICD-10-CM | POA: Diagnosis not present

## 2015-11-26 DIAGNOSIS — Z86711 Personal history of pulmonary embolism: Secondary | ICD-10-CM | POA: Diagnosis not present

## 2015-11-26 DIAGNOSIS — Z7982 Long term (current) use of aspirin: Secondary | ICD-10-CM | POA: Diagnosis not present

## 2015-11-26 DIAGNOSIS — Z794 Long term (current) use of insulin: Secondary | ICD-10-CM | POA: Diagnosis not present

## 2015-11-27 DIAGNOSIS — N183 Chronic kidney disease, stage 3 (moderate): Secondary | ICD-10-CM | POA: Diagnosis not present

## 2015-11-27 DIAGNOSIS — E1122 Type 2 diabetes mellitus with diabetic chronic kidney disease: Secondary | ICD-10-CM | POA: Diagnosis not present

## 2015-11-27 DIAGNOSIS — E1142 Type 2 diabetes mellitus with diabetic polyneuropathy: Secondary | ICD-10-CM | POA: Diagnosis not present

## 2015-11-27 DIAGNOSIS — R262 Difficulty in walking, not elsewhere classified: Secondary | ICD-10-CM | POA: Diagnosis not present

## 2015-11-27 DIAGNOSIS — R2681 Unsteadiness on feet: Secondary | ICD-10-CM | POA: Diagnosis not present

## 2015-11-27 DIAGNOSIS — I270 Primary pulmonary hypertension: Secondary | ICD-10-CM | POA: Diagnosis not present

## 2015-11-27 DIAGNOSIS — E782 Mixed hyperlipidemia: Secondary | ICD-10-CM | POA: Diagnosis not present

## 2015-11-27 DIAGNOSIS — Z86711 Personal history of pulmonary embolism: Secondary | ICD-10-CM | POA: Diagnosis not present

## 2015-11-27 DIAGNOSIS — Z7982 Long term (current) use of aspirin: Secondary | ICD-10-CM | POA: Diagnosis not present

## 2015-11-27 DIAGNOSIS — Z794 Long term (current) use of insulin: Secondary | ICD-10-CM | POA: Diagnosis not present

## 2015-11-27 DIAGNOSIS — Z7901 Long term (current) use of anticoagulants: Secondary | ICD-10-CM | POA: Diagnosis not present

## 2015-11-27 DIAGNOSIS — M6281 Muscle weakness (generalized): Secondary | ICD-10-CM | POA: Diagnosis not present

## 2015-11-30 ENCOUNTER — Other Ambulatory Visit: Payer: Self-pay | Admitting: Unknown Physician Specialty

## 2015-11-30 DIAGNOSIS — M5416 Radiculopathy, lumbar region: Secondary | ICD-10-CM

## 2015-12-01 DIAGNOSIS — R2681 Unsteadiness on feet: Secondary | ICD-10-CM | POA: Diagnosis not present

## 2015-12-01 DIAGNOSIS — Z7982 Long term (current) use of aspirin: Secondary | ICD-10-CM | POA: Diagnosis not present

## 2015-12-01 DIAGNOSIS — E782 Mixed hyperlipidemia: Secondary | ICD-10-CM | POA: Diagnosis not present

## 2015-12-01 DIAGNOSIS — R262 Difficulty in walking, not elsewhere classified: Secondary | ICD-10-CM | POA: Diagnosis not present

## 2015-12-01 DIAGNOSIS — E1122 Type 2 diabetes mellitus with diabetic chronic kidney disease: Secondary | ICD-10-CM | POA: Diagnosis not present

## 2015-12-01 DIAGNOSIS — E1142 Type 2 diabetes mellitus with diabetic polyneuropathy: Secondary | ICD-10-CM | POA: Diagnosis not present

## 2015-12-01 DIAGNOSIS — Z7901 Long term (current) use of anticoagulants: Secondary | ICD-10-CM | POA: Diagnosis not present

## 2015-12-01 DIAGNOSIS — N183 Chronic kidney disease, stage 3 (moderate): Secondary | ICD-10-CM | POA: Diagnosis not present

## 2015-12-01 DIAGNOSIS — M6281 Muscle weakness (generalized): Secondary | ICD-10-CM | POA: Diagnosis not present

## 2015-12-01 DIAGNOSIS — Z794 Long term (current) use of insulin: Secondary | ICD-10-CM | POA: Diagnosis not present

## 2015-12-01 DIAGNOSIS — Z86711 Personal history of pulmonary embolism: Secondary | ICD-10-CM | POA: Diagnosis not present

## 2015-12-01 DIAGNOSIS — I270 Primary pulmonary hypertension: Secondary | ICD-10-CM | POA: Diagnosis not present

## 2015-12-02 DIAGNOSIS — Z794 Long term (current) use of insulin: Secondary | ICD-10-CM | POA: Diagnosis not present

## 2015-12-02 DIAGNOSIS — Z86711 Personal history of pulmonary embolism: Secondary | ICD-10-CM | POA: Diagnosis not present

## 2015-12-02 DIAGNOSIS — I270 Primary pulmonary hypertension: Secondary | ICD-10-CM | POA: Diagnosis not present

## 2015-12-02 DIAGNOSIS — R262 Difficulty in walking, not elsewhere classified: Secondary | ICD-10-CM | POA: Diagnosis not present

## 2015-12-02 DIAGNOSIS — E1142 Type 2 diabetes mellitus with diabetic polyneuropathy: Secondary | ICD-10-CM | POA: Diagnosis not present

## 2015-12-02 DIAGNOSIS — Z7901 Long term (current) use of anticoagulants: Secondary | ICD-10-CM | POA: Diagnosis not present

## 2015-12-02 DIAGNOSIS — R2681 Unsteadiness on feet: Secondary | ICD-10-CM | POA: Diagnosis not present

## 2015-12-02 DIAGNOSIS — M6281 Muscle weakness (generalized): Secondary | ICD-10-CM | POA: Diagnosis not present

## 2015-12-02 DIAGNOSIS — E782 Mixed hyperlipidemia: Secondary | ICD-10-CM | POA: Diagnosis not present

## 2015-12-02 DIAGNOSIS — E1122 Type 2 diabetes mellitus with diabetic chronic kidney disease: Secondary | ICD-10-CM | POA: Diagnosis not present

## 2015-12-02 DIAGNOSIS — N183 Chronic kidney disease, stage 3 (moderate): Secondary | ICD-10-CM | POA: Diagnosis not present

## 2015-12-02 DIAGNOSIS — Z7982 Long term (current) use of aspirin: Secondary | ICD-10-CM | POA: Diagnosis not present

## 2015-12-03 DIAGNOSIS — E782 Mixed hyperlipidemia: Secondary | ICD-10-CM | POA: Diagnosis not present

## 2015-12-03 DIAGNOSIS — Z7982 Long term (current) use of aspirin: Secondary | ICD-10-CM | POA: Diagnosis not present

## 2015-12-03 DIAGNOSIS — Z7901 Long term (current) use of anticoagulants: Secondary | ICD-10-CM | POA: Diagnosis not present

## 2015-12-03 DIAGNOSIS — I270 Primary pulmonary hypertension: Secondary | ICD-10-CM | POA: Diagnosis not present

## 2015-12-03 DIAGNOSIS — N183 Chronic kidney disease, stage 3 (moderate): Secondary | ICD-10-CM | POA: Diagnosis not present

## 2015-12-03 DIAGNOSIS — E1142 Type 2 diabetes mellitus with diabetic polyneuropathy: Secondary | ICD-10-CM | POA: Diagnosis not present

## 2015-12-03 DIAGNOSIS — Z86711 Personal history of pulmonary embolism: Secondary | ICD-10-CM | POA: Diagnosis not present

## 2015-12-03 DIAGNOSIS — R2681 Unsteadiness on feet: Secondary | ICD-10-CM | POA: Diagnosis not present

## 2015-12-03 DIAGNOSIS — Z794 Long term (current) use of insulin: Secondary | ICD-10-CM | POA: Diagnosis not present

## 2015-12-03 DIAGNOSIS — R262 Difficulty in walking, not elsewhere classified: Secondary | ICD-10-CM | POA: Diagnosis not present

## 2015-12-03 DIAGNOSIS — M6281 Muscle weakness (generalized): Secondary | ICD-10-CM | POA: Diagnosis not present

## 2015-12-03 DIAGNOSIS — E1122 Type 2 diabetes mellitus with diabetic chronic kidney disease: Secondary | ICD-10-CM | POA: Diagnosis not present

## 2015-12-04 ENCOUNTER — Ambulatory Visit (INDEPENDENT_AMBULATORY_CARE_PROVIDER_SITE_OTHER): Payer: Medicare Other

## 2015-12-04 ENCOUNTER — Other Ambulatory Visit: Payer: Self-pay | Admitting: Family Medicine

## 2015-12-04 DIAGNOSIS — R52 Pain, unspecified: Secondary | ICD-10-CM

## 2015-12-04 DIAGNOSIS — M5136 Other intervertebral disc degeneration, lumbar region: Secondary | ICD-10-CM

## 2015-12-04 DIAGNOSIS — M6281 Muscle weakness (generalized): Secondary | ICD-10-CM | POA: Diagnosis not present

## 2015-12-04 DIAGNOSIS — R2681 Unsteadiness on feet: Secondary | ICD-10-CM | POA: Diagnosis not present

## 2015-12-04 DIAGNOSIS — N183 Chronic kidney disease, stage 3 (moderate): Secondary | ICD-10-CM | POA: Diagnosis not present

## 2015-12-04 DIAGNOSIS — Z7982 Long term (current) use of aspirin: Secondary | ICD-10-CM | POA: Diagnosis not present

## 2015-12-04 DIAGNOSIS — E782 Mixed hyperlipidemia: Secondary | ICD-10-CM | POA: Diagnosis not present

## 2015-12-04 DIAGNOSIS — I270 Primary pulmonary hypertension: Secondary | ICD-10-CM | POA: Diagnosis not present

## 2015-12-04 DIAGNOSIS — R262 Difficulty in walking, not elsewhere classified: Secondary | ICD-10-CM | POA: Diagnosis not present

## 2015-12-04 DIAGNOSIS — Z794 Long term (current) use of insulin: Secondary | ICD-10-CM | POA: Diagnosis not present

## 2015-12-04 DIAGNOSIS — Z86711 Personal history of pulmonary embolism: Secondary | ICD-10-CM | POA: Diagnosis not present

## 2015-12-04 DIAGNOSIS — Z7901 Long term (current) use of anticoagulants: Secondary | ICD-10-CM | POA: Diagnosis not present

## 2015-12-04 DIAGNOSIS — E1142 Type 2 diabetes mellitus with diabetic polyneuropathy: Secondary | ICD-10-CM | POA: Diagnosis not present

## 2015-12-04 DIAGNOSIS — E1122 Type 2 diabetes mellitus with diabetic chronic kidney disease: Secondary | ICD-10-CM | POA: Diagnosis not present

## 2015-12-07 DIAGNOSIS — R5383 Other fatigue: Secondary | ICD-10-CM | POA: Diagnosis not present

## 2015-12-07 DIAGNOSIS — E785 Hyperlipidemia, unspecified: Secondary | ICD-10-CM | POA: Diagnosis not present

## 2015-12-07 DIAGNOSIS — I10 Essential (primary) hypertension: Secondary | ICD-10-CM | POA: Diagnosis not present

## 2015-12-07 DIAGNOSIS — E1122 Type 2 diabetes mellitus with diabetic chronic kidney disease: Secondary | ICD-10-CM | POA: Diagnosis not present

## 2016-01-14 DIAGNOSIS — E1142 Type 2 diabetes mellitus with diabetic polyneuropathy: Secondary | ICD-10-CM | POA: Diagnosis not present

## 2016-01-14 DIAGNOSIS — Z86718 Personal history of other venous thrombosis and embolism: Secondary | ICD-10-CM | POA: Diagnosis not present

## 2016-02-11 DIAGNOSIS — E1142 Type 2 diabetes mellitus with diabetic polyneuropathy: Secondary | ICD-10-CM | POA: Diagnosis not present

## 2016-02-11 DIAGNOSIS — Z86718 Personal history of other venous thrombosis and embolism: Secondary | ICD-10-CM | POA: Diagnosis not present

## 2016-03-14 DIAGNOSIS — H02059 Trichiasis without entropian unspecified eye, unspecified eyelid: Secondary | ICD-10-CM | POA: Diagnosis not present

## 2016-04-14 DIAGNOSIS — E1122 Type 2 diabetes mellitus with diabetic chronic kidney disease: Secondary | ICD-10-CM | POA: Diagnosis not present

## 2016-04-14 DIAGNOSIS — R5383 Other fatigue: Secondary | ICD-10-CM | POA: Diagnosis not present

## 2016-04-14 DIAGNOSIS — Z23 Encounter for immunization: Secondary | ICD-10-CM | POA: Diagnosis not present

## 2016-04-14 DIAGNOSIS — N183 Chronic kidney disease, stage 3 (moderate): Secondary | ICD-10-CM | POA: Diagnosis not present

## 2016-04-14 DIAGNOSIS — E559 Vitamin D deficiency, unspecified: Secondary | ICD-10-CM | POA: Diagnosis not present

## 2016-04-14 DIAGNOSIS — Z1231 Encounter for screening mammogram for malignant neoplasm of breast: Secondary | ICD-10-CM | POA: Diagnosis not present

## 2016-04-14 DIAGNOSIS — Z0000 Encounter for general adult medical examination without abnormal findings: Secondary | ICD-10-CM | POA: Diagnosis not present

## 2016-04-14 DIAGNOSIS — E785 Hyperlipidemia, unspecified: Secondary | ICD-10-CM | POA: Diagnosis not present

## 2016-04-14 DIAGNOSIS — I10 Essential (primary) hypertension: Secondary | ICD-10-CM | POA: Diagnosis not present

## 2016-04-14 DIAGNOSIS — M8580 Other specified disorders of bone density and structure, unspecified site: Secondary | ICD-10-CM | POA: Diagnosis not present

## 2016-04-14 DIAGNOSIS — E1140 Type 2 diabetes mellitus with diabetic neuropathy, unspecified: Secondary | ICD-10-CM | POA: Diagnosis not present

## 2016-04-19 DIAGNOSIS — Z79899 Other long term (current) drug therapy: Secondary | ICD-10-CM | POA: Diagnosis not present

## 2016-04-19 DIAGNOSIS — Z1211 Encounter for screening for malignant neoplasm of colon: Secondary | ICD-10-CM | POA: Diagnosis not present

## 2016-04-21 DIAGNOSIS — Z86718 Personal history of other venous thrombosis and embolism: Secondary | ICD-10-CM | POA: Diagnosis not present

## 2016-04-21 DIAGNOSIS — E1142 Type 2 diabetes mellitus with diabetic polyneuropathy: Secondary | ICD-10-CM | POA: Diagnosis not present

## 2016-04-24 DIAGNOSIS — S61011A Laceration without foreign body of right thumb without damage to nail, initial encounter: Secondary | ICD-10-CM | POA: Diagnosis not present

## 2016-04-24 DIAGNOSIS — I2510 Atherosclerotic heart disease of native coronary artery without angina pectoris: Secondary | ICD-10-CM | POA: Diagnosis not present

## 2016-04-24 DIAGNOSIS — I160 Hypertensive urgency: Secondary | ICD-10-CM | POA: Diagnosis not present

## 2016-05-03 DIAGNOSIS — Z7901 Long term (current) use of anticoagulants: Secondary | ICD-10-CM | POA: Diagnosis not present

## 2016-05-03 DIAGNOSIS — Z09 Encounter for follow-up examination after completed treatment for conditions other than malignant neoplasm: Secondary | ICD-10-CM | POA: Diagnosis not present

## 2016-05-03 DIAGNOSIS — Z5181 Encounter for therapeutic drug level monitoring: Secondary | ICD-10-CM | POA: Diagnosis not present

## 2016-05-03 DIAGNOSIS — Z1231 Encounter for screening mammogram for malignant neoplasm of breast: Secondary | ICD-10-CM | POA: Diagnosis not present

## 2016-05-03 DIAGNOSIS — Z86711 Personal history of pulmonary embolism: Secondary | ICD-10-CM | POA: Diagnosis not present

## 2016-05-15 DIAGNOSIS — Z886 Allergy status to analgesic agent status: Secondary | ICD-10-CM | POA: Diagnosis not present

## 2016-05-15 DIAGNOSIS — Z7901 Long term (current) use of anticoagulants: Secondary | ICD-10-CM | POA: Diagnosis not present

## 2016-05-15 DIAGNOSIS — Z794 Long term (current) use of insulin: Secondary | ICD-10-CM | POA: Diagnosis not present

## 2016-05-15 DIAGNOSIS — E119 Type 2 diabetes mellitus without complications: Secondary | ICD-10-CM | POA: Diagnosis not present

## 2016-05-15 DIAGNOSIS — Z880 Allergy status to penicillin: Secondary | ICD-10-CM | POA: Diagnosis not present

## 2016-05-15 DIAGNOSIS — K068 Other specified disorders of gingiva and edentulous alveolar ridge: Secondary | ICD-10-CM | POA: Diagnosis not present

## 2016-05-15 DIAGNOSIS — I10 Essential (primary) hypertension: Secondary | ICD-10-CM | POA: Diagnosis not present

## 2016-05-15 DIAGNOSIS — Z86711 Personal history of pulmonary embolism: Secondary | ICD-10-CM | POA: Diagnosis not present

## 2016-05-15 DIAGNOSIS — Z87891 Personal history of nicotine dependence: Secondary | ICD-10-CM | POA: Diagnosis not present

## 2016-05-15 DIAGNOSIS — E1140 Type 2 diabetes mellitus with diabetic neuropathy, unspecified: Secondary | ICD-10-CM | POA: Diagnosis not present

## 2016-05-15 DIAGNOSIS — Z888 Allergy status to other drugs, medicaments and biological substances status: Secondary | ICD-10-CM | POA: Diagnosis not present

## 2016-05-15 DIAGNOSIS — Z885 Allergy status to narcotic agent status: Secondary | ICD-10-CM | POA: Diagnosis not present

## 2016-05-15 DIAGNOSIS — E7800 Pure hypercholesterolemia, unspecified: Secondary | ICD-10-CM | POA: Diagnosis not present

## 2016-05-15 DIAGNOSIS — Z79899 Other long term (current) drug therapy: Secondary | ICD-10-CM | POA: Diagnosis not present

## 2016-05-20 DIAGNOSIS — H401132 Primary open-angle glaucoma, bilateral, moderate stage: Secondary | ICD-10-CM | POA: Diagnosis not present

## 2016-06-13 DIAGNOSIS — N3000 Acute cystitis without hematuria: Secondary | ICD-10-CM | POA: Diagnosis not present

## 2016-06-30 DIAGNOSIS — E1142 Type 2 diabetes mellitus with diabetic polyneuropathy: Secondary | ICD-10-CM | POA: Diagnosis not present

## 2016-08-23 DIAGNOSIS — E785 Hyperlipidemia, unspecified: Secondary | ICD-10-CM | POA: Diagnosis not present

## 2016-08-23 DIAGNOSIS — E1140 Type 2 diabetes mellitus with diabetic neuropathy, unspecified: Secondary | ICD-10-CM | POA: Diagnosis not present

## 2016-08-23 DIAGNOSIS — N183 Chronic kidney disease, stage 3 (moderate): Secondary | ICD-10-CM | POA: Diagnosis not present

## 2016-08-23 DIAGNOSIS — I10 Essential (primary) hypertension: Secondary | ICD-10-CM | POA: Diagnosis not present

## 2016-08-23 DIAGNOSIS — E1122 Type 2 diabetes mellitus with diabetic chronic kidney disease: Secondary | ICD-10-CM | POA: Diagnosis not present

## 2016-10-07 DIAGNOSIS — I10 Essential (primary) hypertension: Secondary | ICD-10-CM | POA: Diagnosis not present

## 2016-10-07 DIAGNOSIS — Z955 Presence of coronary angioplasty implant and graft: Secondary | ICD-10-CM | POA: Diagnosis not present

## 2016-10-07 DIAGNOSIS — I2510 Atherosclerotic heart disease of native coronary artery without angina pectoris: Secondary | ICD-10-CM | POA: Diagnosis not present

## 2016-10-07 DIAGNOSIS — I160 Hypertensive urgency: Secondary | ICD-10-CM | POA: Diagnosis not present

## 2016-11-28 DIAGNOSIS — H401132 Primary open-angle glaucoma, bilateral, moderate stage: Secondary | ICD-10-CM | POA: Diagnosis not present

## 2016-12-29 DIAGNOSIS — N183 Chronic kidney disease, stage 3 (moderate): Secondary | ICD-10-CM | POA: Diagnosis not present

## 2016-12-29 DIAGNOSIS — E785 Hyperlipidemia, unspecified: Secondary | ICD-10-CM | POA: Diagnosis not present

## 2016-12-29 DIAGNOSIS — E1142 Type 2 diabetes mellitus with diabetic polyneuropathy: Secondary | ICD-10-CM | POA: Diagnosis not present

## 2016-12-29 DIAGNOSIS — I10 Essential (primary) hypertension: Secondary | ICD-10-CM | POA: Diagnosis not present

## 2016-12-29 DIAGNOSIS — E1122 Type 2 diabetes mellitus with diabetic chronic kidney disease: Secondary | ICD-10-CM | POA: Diagnosis not present

## 2017-04-22 DIAGNOSIS — E785 Hyperlipidemia, unspecified: Secondary | ICD-10-CM | POA: Diagnosis not present

## 2017-04-22 DIAGNOSIS — I672 Cerebral atherosclerosis: Secondary | ICD-10-CM | POA: Diagnosis not present

## 2017-04-22 DIAGNOSIS — I472 Ventricular tachycardia: Secondary | ICD-10-CM | POA: Diagnosis not present

## 2017-04-22 DIAGNOSIS — I088 Other rheumatic multiple valve diseases: Secondary | ICD-10-CM | POA: Diagnosis not present

## 2017-04-22 DIAGNOSIS — I517 Cardiomegaly: Secondary | ICD-10-CM | POA: Diagnosis not present

## 2017-04-22 DIAGNOSIS — I272 Pulmonary hypertension, unspecified: Secondary | ICD-10-CM | POA: Diagnosis not present

## 2017-04-22 DIAGNOSIS — R200 Anesthesia of skin: Secondary | ICD-10-CM | POA: Diagnosis not present

## 2017-04-22 DIAGNOSIS — E1165 Type 2 diabetes mellitus with hyperglycemia: Secondary | ICD-10-CM | POA: Diagnosis not present

## 2017-04-22 DIAGNOSIS — R531 Weakness: Secondary | ICD-10-CM | POA: Diagnosis not present

## 2017-04-22 DIAGNOSIS — Z86711 Personal history of pulmonary embolism: Secondary | ICD-10-CM | POA: Diagnosis not present

## 2017-04-22 DIAGNOSIS — E119 Type 2 diabetes mellitus without complications: Secondary | ICD-10-CM | POA: Diagnosis not present

## 2017-04-22 DIAGNOSIS — G458 Other transient cerebral ischemic attacks and related syndromes: Secondary | ICD-10-CM | POA: Diagnosis not present

## 2017-04-22 DIAGNOSIS — G319 Degenerative disease of nervous system, unspecified: Secondary | ICD-10-CM | POA: Diagnosis not present

## 2017-04-22 DIAGNOSIS — R404 Transient alteration of awareness: Secondary | ICD-10-CM | POA: Diagnosis not present

## 2017-04-22 DIAGNOSIS — I6529 Occlusion and stenosis of unspecified carotid artery: Secondary | ICD-10-CM | POA: Diagnosis not present

## 2017-04-22 DIAGNOSIS — I6501 Occlusion and stenosis of right vertebral artery: Secondary | ICD-10-CM | POA: Diagnosis not present

## 2017-04-22 DIAGNOSIS — Z87891 Personal history of nicotine dependence: Secondary | ICD-10-CM | POA: Diagnosis not present

## 2017-04-22 DIAGNOSIS — R9082 White matter disease, unspecified: Secondary | ICD-10-CM | POA: Diagnosis not present

## 2017-04-22 DIAGNOSIS — Z880 Allergy status to penicillin: Secondary | ICD-10-CM | POA: Diagnosis not present

## 2017-04-22 DIAGNOSIS — E1142 Type 2 diabetes mellitus with diabetic polyneuropathy: Secondary | ICD-10-CM | POA: Diagnosis not present

## 2017-04-22 DIAGNOSIS — Z886 Allergy status to analgesic agent status: Secondary | ICD-10-CM | POA: Diagnosis not present

## 2017-04-22 DIAGNOSIS — I6782 Cerebral ischemia: Secondary | ICD-10-CM | POA: Diagnosis not present

## 2017-04-22 DIAGNOSIS — I6350 Cerebral infarction due to unspecified occlusion or stenosis of unspecified cerebral artery: Secondary | ICD-10-CM | POA: Diagnosis not present

## 2017-04-22 DIAGNOSIS — M7989 Other specified soft tissue disorders: Secondary | ICD-10-CM | POA: Diagnosis not present

## 2017-04-22 DIAGNOSIS — I639 Cerebral infarction, unspecified: Secondary | ICD-10-CM | POA: Diagnosis not present

## 2017-04-22 DIAGNOSIS — I2510 Atherosclerotic heart disease of native coronary artery without angina pectoris: Secondary | ICD-10-CM | POA: Diagnosis not present

## 2017-04-22 DIAGNOSIS — Z7901 Long term (current) use of anticoagulants: Secondary | ICD-10-CM | POA: Diagnosis not present

## 2017-04-22 DIAGNOSIS — R42 Dizziness and giddiness: Secondary | ICD-10-CM | POA: Diagnosis not present

## 2017-04-22 DIAGNOSIS — E870 Hyperosmolality and hypernatremia: Secondary | ICD-10-CM | POA: Diagnosis not present

## 2017-04-22 DIAGNOSIS — Z794 Long term (current) use of insulin: Secondary | ICD-10-CM | POA: Diagnosis not present

## 2017-04-22 DIAGNOSIS — I10 Essential (primary) hypertension: Secondary | ICD-10-CM | POA: Diagnosis not present

## 2017-04-22 DIAGNOSIS — R001 Bradycardia, unspecified: Secondary | ICD-10-CM | POA: Diagnosis not present

## 2017-04-22 DIAGNOSIS — Z888 Allergy status to other drugs, medicaments and biological substances status: Secondary | ICD-10-CM | POA: Diagnosis not present

## 2017-04-22 DIAGNOSIS — I519 Heart disease, unspecified: Secondary | ICD-10-CM | POA: Diagnosis not present

## 2017-04-22 DIAGNOSIS — R29818 Other symptoms and signs involving the nervous system: Secondary | ICD-10-CM | POA: Diagnosis not present

## 2017-04-23 DIAGNOSIS — R001 Bradycardia, unspecified: Secondary | ICD-10-CM | POA: Diagnosis not present

## 2017-04-23 DIAGNOSIS — R42 Dizziness and giddiness: Secondary | ICD-10-CM | POA: Diagnosis not present

## 2017-04-23 DIAGNOSIS — G458 Other transient cerebral ischemic attacks and related syndromes: Secondary | ICD-10-CM | POA: Diagnosis not present

## 2017-04-23 DIAGNOSIS — E870 Hyperosmolality and hypernatremia: Secondary | ICD-10-CM | POA: Diagnosis not present

## 2017-04-23 DIAGNOSIS — Z86711 Personal history of pulmonary embolism: Secondary | ICD-10-CM | POA: Diagnosis not present

## 2017-04-23 DIAGNOSIS — I2510 Atherosclerotic heart disease of native coronary artery without angina pectoris: Secondary | ICD-10-CM | POA: Diagnosis not present

## 2017-04-23 DIAGNOSIS — E1142 Type 2 diabetes mellitus with diabetic polyneuropathy: Secondary | ICD-10-CM | POA: Diagnosis not present

## 2017-04-23 DIAGNOSIS — I472 Ventricular tachycardia: Secondary | ICD-10-CM | POA: Diagnosis not present

## 2017-04-23 DIAGNOSIS — Z7901 Long term (current) use of anticoagulants: Secondary | ICD-10-CM | POA: Diagnosis not present

## 2017-04-23 DIAGNOSIS — Z880 Allergy status to penicillin: Secondary | ICD-10-CM | POA: Diagnosis not present

## 2017-04-23 DIAGNOSIS — Z888 Allergy status to other drugs, medicaments and biological substances status: Secondary | ICD-10-CM | POA: Diagnosis not present

## 2017-04-23 DIAGNOSIS — E785 Hyperlipidemia, unspecified: Secondary | ICD-10-CM | POA: Diagnosis not present

## 2017-04-23 DIAGNOSIS — I10 Essential (primary) hypertension: Secondary | ICD-10-CM | POA: Diagnosis not present

## 2017-04-23 DIAGNOSIS — Z886 Allergy status to analgesic agent status: Secondary | ICD-10-CM | POA: Diagnosis not present

## 2017-04-23 DIAGNOSIS — Z87891 Personal history of nicotine dependence: Secondary | ICD-10-CM | POA: Diagnosis not present

## 2017-04-23 DIAGNOSIS — I272 Pulmonary hypertension, unspecified: Secondary | ICD-10-CM | POA: Diagnosis not present

## 2017-04-23 DIAGNOSIS — I639 Cerebral infarction, unspecified: Secondary | ICD-10-CM | POA: Diagnosis not present

## 2017-04-25 DIAGNOSIS — E785 Hyperlipidemia, unspecified: Secondary | ICD-10-CM | POA: Diagnosis not present

## 2017-04-25 DIAGNOSIS — Z86711 Personal history of pulmonary embolism: Secondary | ICD-10-CM | POA: Diagnosis not present

## 2017-04-25 DIAGNOSIS — I639 Cerebral infarction, unspecified: Secondary | ICD-10-CM | POA: Diagnosis not present

## 2017-04-25 DIAGNOSIS — I10 Essential (primary) hypertension: Secondary | ICD-10-CM | POA: Diagnosis not present

## 2017-04-25 DIAGNOSIS — Z888 Allergy status to other drugs, medicaments and biological substances status: Secondary | ICD-10-CM | POA: Diagnosis not present

## 2017-04-25 DIAGNOSIS — I272 Pulmonary hypertension, unspecified: Secondary | ICD-10-CM | POA: Diagnosis not present

## 2017-04-25 DIAGNOSIS — G458 Other transient cerebral ischemic attacks and related syndromes: Secondary | ICD-10-CM | POA: Diagnosis not present

## 2017-04-25 DIAGNOSIS — Z880 Allergy status to penicillin: Secondary | ICD-10-CM | POA: Diagnosis not present

## 2017-04-25 DIAGNOSIS — Z886 Allergy status to analgesic agent status: Secondary | ICD-10-CM | POA: Diagnosis not present

## 2017-04-25 DIAGNOSIS — I2510 Atherosclerotic heart disease of native coronary artery without angina pectoris: Secondary | ICD-10-CM | POA: Diagnosis not present

## 2017-04-25 DIAGNOSIS — Z7901 Long term (current) use of anticoagulants: Secondary | ICD-10-CM | POA: Diagnosis not present

## 2017-04-25 DIAGNOSIS — E870 Hyperosmolality and hypernatremia: Secondary | ICD-10-CM | POA: Diagnosis not present

## 2017-04-25 DIAGNOSIS — Z87891 Personal history of nicotine dependence: Secondary | ICD-10-CM | POA: Diagnosis not present

## 2017-04-25 DIAGNOSIS — E1142 Type 2 diabetes mellitus with diabetic polyneuropathy: Secondary | ICD-10-CM | POA: Diagnosis not present

## 2017-04-25 DIAGNOSIS — I472 Ventricular tachycardia: Secondary | ICD-10-CM | POA: Diagnosis not present

## 2017-04-25 DIAGNOSIS — R001 Bradycardia, unspecified: Secondary | ICD-10-CM | POA: Diagnosis not present

## 2017-04-25 DIAGNOSIS — R42 Dizziness and giddiness: Secondary | ICD-10-CM | POA: Diagnosis not present

## 2017-04-26 DIAGNOSIS — I472 Ventricular tachycardia: Secondary | ICD-10-CM | POA: Diagnosis not present

## 2017-04-26 DIAGNOSIS — Z888 Allergy status to other drugs, medicaments and biological substances status: Secondary | ICD-10-CM | POA: Diagnosis not present

## 2017-04-26 DIAGNOSIS — I272 Pulmonary hypertension, unspecified: Secondary | ICD-10-CM | POA: Diagnosis not present

## 2017-04-26 DIAGNOSIS — R001 Bradycardia, unspecified: Secondary | ICD-10-CM | POA: Diagnosis not present

## 2017-04-26 DIAGNOSIS — Z886 Allergy status to analgesic agent status: Secondary | ICD-10-CM | POA: Diagnosis not present

## 2017-04-26 DIAGNOSIS — Z880 Allergy status to penicillin: Secondary | ICD-10-CM | POA: Diagnosis not present

## 2017-04-26 DIAGNOSIS — Z86711 Personal history of pulmonary embolism: Secondary | ICD-10-CM | POA: Diagnosis not present

## 2017-04-26 DIAGNOSIS — R42 Dizziness and giddiness: Secondary | ICD-10-CM | POA: Diagnosis not present

## 2017-04-26 DIAGNOSIS — E870 Hyperosmolality and hypernatremia: Secondary | ICD-10-CM | POA: Diagnosis not present

## 2017-04-26 DIAGNOSIS — G458 Other transient cerebral ischemic attacks and related syndromes: Secondary | ICD-10-CM | POA: Diagnosis not present

## 2017-04-26 DIAGNOSIS — E785 Hyperlipidemia, unspecified: Secondary | ICD-10-CM | POA: Diagnosis not present

## 2017-04-26 DIAGNOSIS — Z87891 Personal history of nicotine dependence: Secondary | ICD-10-CM | POA: Diagnosis not present

## 2017-04-26 DIAGNOSIS — I2510 Atherosclerotic heart disease of native coronary artery without angina pectoris: Secondary | ICD-10-CM | POA: Diagnosis not present

## 2017-04-26 DIAGNOSIS — I10 Essential (primary) hypertension: Secondary | ICD-10-CM | POA: Diagnosis not present

## 2017-04-26 DIAGNOSIS — E1142 Type 2 diabetes mellitus with diabetic polyneuropathy: Secondary | ICD-10-CM | POA: Diagnosis not present

## 2017-04-26 DIAGNOSIS — Z7901 Long term (current) use of anticoagulants: Secondary | ICD-10-CM | POA: Diagnosis not present

## 2017-05-02 DIAGNOSIS — G458 Other transient cerebral ischemic attacks and related syndromes: Secondary | ICD-10-CM | POA: Diagnosis not present

## 2017-05-02 DIAGNOSIS — I160 Hypertensive urgency: Secondary | ICD-10-CM | POA: Diagnosis not present

## 2017-05-02 DIAGNOSIS — I272 Pulmonary hypertension, unspecified: Secondary | ICD-10-CM | POA: Diagnosis not present

## 2017-05-02 DIAGNOSIS — I10 Essential (primary) hypertension: Secondary | ICD-10-CM | POA: Diagnosis not present

## 2017-05-02 DIAGNOSIS — Z794 Long term (current) use of insulin: Secondary | ICD-10-CM | POA: Diagnosis not present

## 2017-05-02 DIAGNOSIS — Z86711 Personal history of pulmonary embolism: Secondary | ICD-10-CM | POA: Diagnosis not present

## 2017-05-02 DIAGNOSIS — Z87891 Personal history of nicotine dependence: Secondary | ICD-10-CM | POA: Diagnosis not present

## 2017-05-02 DIAGNOSIS — E559 Vitamin D deficiency, unspecified: Secondary | ICD-10-CM | POA: Diagnosis not present

## 2017-05-02 DIAGNOSIS — I2510 Atherosclerotic heart disease of native coronary artery without angina pectoris: Secondary | ICD-10-CM | POA: Diagnosis not present

## 2017-05-02 DIAGNOSIS — E538 Deficiency of other specified B group vitamins: Secondary | ICD-10-CM | POA: Diagnosis not present

## 2017-05-02 DIAGNOSIS — E1142 Type 2 diabetes mellitus with diabetic polyneuropathy: Secondary | ICD-10-CM | POA: Diagnosis not present

## 2017-05-02 DIAGNOSIS — E1165 Type 2 diabetes mellitus with hyperglycemia: Secondary | ICD-10-CM | POA: Diagnosis not present

## 2017-05-02 DIAGNOSIS — Z7901 Long term (current) use of anticoagulants: Secondary | ICD-10-CM | POA: Diagnosis not present

## 2017-05-05 DIAGNOSIS — I10 Essential (primary) hypertension: Secondary | ICD-10-CM | POA: Diagnosis not present

## 2017-05-05 DIAGNOSIS — E1142 Type 2 diabetes mellitus with diabetic polyneuropathy: Secondary | ICD-10-CM | POA: Diagnosis not present

## 2017-05-05 DIAGNOSIS — Z7901 Long term (current) use of anticoagulants: Secondary | ICD-10-CM | POA: Diagnosis not present

## 2017-05-05 DIAGNOSIS — I272 Pulmonary hypertension, unspecified: Secondary | ICD-10-CM | POA: Diagnosis not present

## 2017-05-05 DIAGNOSIS — Z794 Long term (current) use of insulin: Secondary | ICD-10-CM | POA: Diagnosis not present

## 2017-05-05 DIAGNOSIS — E1165 Type 2 diabetes mellitus with hyperglycemia: Secondary | ICD-10-CM | POA: Diagnosis not present

## 2017-05-05 DIAGNOSIS — Z86711 Personal history of pulmonary embolism: Secondary | ICD-10-CM | POA: Diagnosis not present

## 2017-05-05 DIAGNOSIS — Z87891 Personal history of nicotine dependence: Secondary | ICD-10-CM | POA: Diagnosis not present

## 2017-05-05 DIAGNOSIS — I2510 Atherosclerotic heart disease of native coronary artery without angina pectoris: Secondary | ICD-10-CM | POA: Diagnosis not present

## 2017-05-08 DIAGNOSIS — Z86711 Personal history of pulmonary embolism: Secondary | ICD-10-CM | POA: Diagnosis not present

## 2017-05-08 DIAGNOSIS — Z87891 Personal history of nicotine dependence: Secondary | ICD-10-CM | POA: Diagnosis not present

## 2017-05-08 DIAGNOSIS — I10 Essential (primary) hypertension: Secondary | ICD-10-CM | POA: Diagnosis not present

## 2017-05-08 DIAGNOSIS — Z794 Long term (current) use of insulin: Secondary | ICD-10-CM | POA: Diagnosis not present

## 2017-05-08 DIAGNOSIS — E1165 Type 2 diabetes mellitus with hyperglycemia: Secondary | ICD-10-CM | POA: Diagnosis not present

## 2017-05-08 DIAGNOSIS — Z7901 Long term (current) use of anticoagulants: Secondary | ICD-10-CM | POA: Diagnosis not present

## 2017-05-08 DIAGNOSIS — E1142 Type 2 diabetes mellitus with diabetic polyneuropathy: Secondary | ICD-10-CM | POA: Diagnosis not present

## 2017-05-08 DIAGNOSIS — I2510 Atherosclerotic heart disease of native coronary artery without angina pectoris: Secondary | ICD-10-CM | POA: Diagnosis not present

## 2017-05-08 DIAGNOSIS — I272 Pulmonary hypertension, unspecified: Secondary | ICD-10-CM | POA: Diagnosis not present

## 2017-05-10 DIAGNOSIS — E1165 Type 2 diabetes mellitus with hyperglycemia: Secondary | ICD-10-CM | POA: Diagnosis not present

## 2017-05-10 DIAGNOSIS — Z7901 Long term (current) use of anticoagulants: Secondary | ICD-10-CM | POA: Diagnosis not present

## 2017-05-10 DIAGNOSIS — Z86711 Personal history of pulmonary embolism: Secondary | ICD-10-CM | POA: Diagnosis not present

## 2017-05-10 DIAGNOSIS — I2510 Atherosclerotic heart disease of native coronary artery without angina pectoris: Secondary | ICD-10-CM | POA: Diagnosis not present

## 2017-05-10 DIAGNOSIS — I272 Pulmonary hypertension, unspecified: Secondary | ICD-10-CM | POA: Diagnosis not present

## 2017-05-10 DIAGNOSIS — E1142 Type 2 diabetes mellitus with diabetic polyneuropathy: Secondary | ICD-10-CM | POA: Diagnosis not present

## 2017-05-10 DIAGNOSIS — I10 Essential (primary) hypertension: Secondary | ICD-10-CM | POA: Diagnosis not present

## 2017-05-10 DIAGNOSIS — Z794 Long term (current) use of insulin: Secondary | ICD-10-CM | POA: Diagnosis not present

## 2017-05-10 DIAGNOSIS — Z87891 Personal history of nicotine dependence: Secondary | ICD-10-CM | POA: Diagnosis not present

## 2017-05-11 DIAGNOSIS — M8580 Other specified disorders of bone density and structure, unspecified site: Secondary | ICD-10-CM | POA: Diagnosis not present

## 2017-05-11 DIAGNOSIS — I10 Essential (primary) hypertension: Secondary | ICD-10-CM | POA: Diagnosis not present

## 2017-05-11 DIAGNOSIS — E559 Vitamin D deficiency, unspecified: Secondary | ICD-10-CM | POA: Diagnosis not present

## 2017-05-11 DIAGNOSIS — E1122 Type 2 diabetes mellitus with diabetic chronic kidney disease: Secondary | ICD-10-CM | POA: Diagnosis not present

## 2017-05-11 DIAGNOSIS — I2510 Atherosclerotic heart disease of native coronary artery without angina pectoris: Secondary | ICD-10-CM | POA: Diagnosis not present

## 2017-05-11 DIAGNOSIS — R5383 Other fatigue: Secondary | ICD-10-CM | POA: Diagnosis not present

## 2017-05-11 DIAGNOSIS — Z1231 Encounter for screening mammogram for malignant neoplasm of breast: Secondary | ICD-10-CM | POA: Diagnosis not present

## 2017-05-11 DIAGNOSIS — Z23 Encounter for immunization: Secondary | ICD-10-CM | POA: Diagnosis not present

## 2017-05-11 DIAGNOSIS — M899 Disorder of bone, unspecified: Secondary | ICD-10-CM | POA: Diagnosis not present

## 2017-05-11 DIAGNOSIS — Z0000 Encounter for general adult medical examination without abnormal findings: Secondary | ICD-10-CM | POA: Diagnosis not present

## 2017-05-12 DIAGNOSIS — E1142 Type 2 diabetes mellitus with diabetic polyneuropathy: Secondary | ICD-10-CM | POA: Diagnosis not present

## 2017-05-12 DIAGNOSIS — I10 Essential (primary) hypertension: Secondary | ICD-10-CM | POA: Diagnosis not present

## 2017-05-12 DIAGNOSIS — Z794 Long term (current) use of insulin: Secondary | ICD-10-CM | POA: Diagnosis not present

## 2017-05-12 DIAGNOSIS — I2510 Atherosclerotic heart disease of native coronary artery without angina pectoris: Secondary | ICD-10-CM | POA: Diagnosis not present

## 2017-05-12 DIAGNOSIS — E1165 Type 2 diabetes mellitus with hyperglycemia: Secondary | ICD-10-CM | POA: Diagnosis not present

## 2017-05-12 DIAGNOSIS — Z7901 Long term (current) use of anticoagulants: Secondary | ICD-10-CM | POA: Diagnosis not present

## 2017-05-12 DIAGNOSIS — Z86711 Personal history of pulmonary embolism: Secondary | ICD-10-CM | POA: Diagnosis not present

## 2017-05-12 DIAGNOSIS — Z87891 Personal history of nicotine dependence: Secondary | ICD-10-CM | POA: Diagnosis not present

## 2017-05-12 DIAGNOSIS — I272 Pulmonary hypertension, unspecified: Secondary | ICD-10-CM | POA: Diagnosis not present

## 2017-05-15 DIAGNOSIS — Z7901 Long term (current) use of anticoagulants: Secondary | ICD-10-CM | POA: Diagnosis not present

## 2017-05-15 DIAGNOSIS — I10 Essential (primary) hypertension: Secondary | ICD-10-CM | POA: Diagnosis not present

## 2017-05-15 DIAGNOSIS — E1142 Type 2 diabetes mellitus with diabetic polyneuropathy: Secondary | ICD-10-CM | POA: Diagnosis not present

## 2017-05-15 DIAGNOSIS — I272 Pulmonary hypertension, unspecified: Secondary | ICD-10-CM | POA: Diagnosis not present

## 2017-05-15 DIAGNOSIS — Z794 Long term (current) use of insulin: Secondary | ICD-10-CM | POA: Diagnosis not present

## 2017-05-15 DIAGNOSIS — Z87891 Personal history of nicotine dependence: Secondary | ICD-10-CM | POA: Diagnosis not present

## 2017-05-15 DIAGNOSIS — Z86711 Personal history of pulmonary embolism: Secondary | ICD-10-CM | POA: Diagnosis not present

## 2017-05-15 DIAGNOSIS — I2510 Atherosclerotic heart disease of native coronary artery without angina pectoris: Secondary | ICD-10-CM | POA: Diagnosis not present

## 2017-05-15 DIAGNOSIS — E1165 Type 2 diabetes mellitus with hyperglycemia: Secondary | ICD-10-CM | POA: Diagnosis not present

## 2017-05-16 DIAGNOSIS — Z86711 Personal history of pulmonary embolism: Secondary | ICD-10-CM | POA: Diagnosis not present

## 2017-05-16 DIAGNOSIS — E1142 Type 2 diabetes mellitus with diabetic polyneuropathy: Secondary | ICD-10-CM | POA: Diagnosis not present

## 2017-05-16 DIAGNOSIS — I10 Essential (primary) hypertension: Secondary | ICD-10-CM | POA: Diagnosis not present

## 2017-05-16 DIAGNOSIS — I2510 Atherosclerotic heart disease of native coronary artery without angina pectoris: Secondary | ICD-10-CM | POA: Diagnosis not present

## 2017-05-16 DIAGNOSIS — Z87891 Personal history of nicotine dependence: Secondary | ICD-10-CM | POA: Diagnosis not present

## 2017-05-16 DIAGNOSIS — Z7901 Long term (current) use of anticoagulants: Secondary | ICD-10-CM | POA: Diagnosis not present

## 2017-05-16 DIAGNOSIS — I272 Pulmonary hypertension, unspecified: Secondary | ICD-10-CM | POA: Diagnosis not present

## 2017-05-16 DIAGNOSIS — Z794 Long term (current) use of insulin: Secondary | ICD-10-CM | POA: Diagnosis not present

## 2017-05-16 DIAGNOSIS — E1165 Type 2 diabetes mellitus with hyperglycemia: Secondary | ICD-10-CM | POA: Diagnosis not present

## 2017-05-17 DIAGNOSIS — I10 Essential (primary) hypertension: Secondary | ICD-10-CM | POA: Diagnosis not present

## 2017-05-17 DIAGNOSIS — E1165 Type 2 diabetes mellitus with hyperglycemia: Secondary | ICD-10-CM | POA: Diagnosis not present

## 2017-05-17 DIAGNOSIS — Z7901 Long term (current) use of anticoagulants: Secondary | ICD-10-CM | POA: Diagnosis not present

## 2017-05-17 DIAGNOSIS — I2510 Atherosclerotic heart disease of native coronary artery without angina pectoris: Secondary | ICD-10-CM | POA: Diagnosis not present

## 2017-05-17 DIAGNOSIS — Z794 Long term (current) use of insulin: Secondary | ICD-10-CM | POA: Diagnosis not present

## 2017-05-17 DIAGNOSIS — I272 Pulmonary hypertension, unspecified: Secondary | ICD-10-CM | POA: Diagnosis not present

## 2017-05-17 DIAGNOSIS — Z86711 Personal history of pulmonary embolism: Secondary | ICD-10-CM | POA: Diagnosis not present

## 2017-05-17 DIAGNOSIS — Z87891 Personal history of nicotine dependence: Secondary | ICD-10-CM | POA: Diagnosis not present

## 2017-05-17 DIAGNOSIS — E1142 Type 2 diabetes mellitus with diabetic polyneuropathy: Secondary | ICD-10-CM | POA: Diagnosis not present

## 2017-05-18 DIAGNOSIS — I10 Essential (primary) hypertension: Secondary | ICD-10-CM | POA: Diagnosis not present

## 2017-05-18 DIAGNOSIS — I2510 Atherosclerotic heart disease of native coronary artery without angina pectoris: Secondary | ICD-10-CM | POA: Diagnosis not present

## 2017-05-18 DIAGNOSIS — Z86711 Personal history of pulmonary embolism: Secondary | ICD-10-CM | POA: Diagnosis not present

## 2017-05-18 DIAGNOSIS — Z794 Long term (current) use of insulin: Secondary | ICD-10-CM | POA: Diagnosis not present

## 2017-05-18 DIAGNOSIS — I272 Pulmonary hypertension, unspecified: Secondary | ICD-10-CM | POA: Diagnosis not present

## 2017-05-18 DIAGNOSIS — E1142 Type 2 diabetes mellitus with diabetic polyneuropathy: Secondary | ICD-10-CM | POA: Diagnosis not present

## 2017-05-18 DIAGNOSIS — E1165 Type 2 diabetes mellitus with hyperglycemia: Secondary | ICD-10-CM | POA: Diagnosis not present

## 2017-05-18 DIAGNOSIS — Z7901 Long term (current) use of anticoagulants: Secondary | ICD-10-CM | POA: Diagnosis not present

## 2017-05-18 DIAGNOSIS — Z87891 Personal history of nicotine dependence: Secondary | ICD-10-CM | POA: Diagnosis not present

## 2017-05-22 DIAGNOSIS — H401132 Primary open-angle glaucoma, bilateral, moderate stage: Secondary | ICD-10-CM | POA: Diagnosis not present

## 2017-05-22 DIAGNOSIS — I2699 Other pulmonary embolism without acute cor pulmonale: Secondary | ICD-10-CM | POA: Diagnosis not present

## 2017-05-22 DIAGNOSIS — Z5181 Encounter for therapeutic drug level monitoring: Secondary | ICD-10-CM | POA: Diagnosis not present

## 2017-05-22 DIAGNOSIS — Z87891 Personal history of nicotine dependence: Secondary | ICD-10-CM | POA: Diagnosis not present

## 2017-05-22 DIAGNOSIS — G458 Other transient cerebral ischemic attacks and related syndromes: Secondary | ICD-10-CM | POA: Diagnosis not present

## 2017-05-22 DIAGNOSIS — Z8673 Personal history of transient ischemic attack (TIA), and cerebral infarction without residual deficits: Secondary | ICD-10-CM | POA: Diagnosis not present

## 2017-05-22 DIAGNOSIS — Z7901 Long term (current) use of anticoagulants: Secondary | ICD-10-CM | POA: Diagnosis not present

## 2017-05-22 DIAGNOSIS — Z09 Encounter for follow-up examination after completed treatment for conditions other than malignant neoplasm: Secondary | ICD-10-CM | POA: Diagnosis not present

## 2017-05-22 DIAGNOSIS — Z86711 Personal history of pulmonary embolism: Secondary | ICD-10-CM | POA: Diagnosis not present

## 2017-05-23 DIAGNOSIS — Z86711 Personal history of pulmonary embolism: Secondary | ICD-10-CM | POA: Diagnosis not present

## 2017-05-23 DIAGNOSIS — I10 Essential (primary) hypertension: Secondary | ICD-10-CM | POA: Diagnosis not present

## 2017-05-23 DIAGNOSIS — Z7901 Long term (current) use of anticoagulants: Secondary | ICD-10-CM | POA: Diagnosis not present

## 2017-05-23 DIAGNOSIS — I272 Pulmonary hypertension, unspecified: Secondary | ICD-10-CM | POA: Diagnosis not present

## 2017-05-23 DIAGNOSIS — E1165 Type 2 diabetes mellitus with hyperglycemia: Secondary | ICD-10-CM | POA: Diagnosis not present

## 2017-05-23 DIAGNOSIS — I2510 Atherosclerotic heart disease of native coronary artery without angina pectoris: Secondary | ICD-10-CM | POA: Diagnosis not present

## 2017-05-23 DIAGNOSIS — Z794 Long term (current) use of insulin: Secondary | ICD-10-CM | POA: Diagnosis not present

## 2017-05-23 DIAGNOSIS — Z87891 Personal history of nicotine dependence: Secondary | ICD-10-CM | POA: Diagnosis not present

## 2017-05-23 DIAGNOSIS — E1142 Type 2 diabetes mellitus with diabetic polyneuropathy: Secondary | ICD-10-CM | POA: Diagnosis not present

## 2017-05-24 DIAGNOSIS — Z86711 Personal history of pulmonary embolism: Secondary | ICD-10-CM | POA: Diagnosis not present

## 2017-05-24 DIAGNOSIS — I2510 Atherosclerotic heart disease of native coronary artery without angina pectoris: Secondary | ICD-10-CM | POA: Diagnosis not present

## 2017-05-24 DIAGNOSIS — Z794 Long term (current) use of insulin: Secondary | ICD-10-CM | POA: Diagnosis not present

## 2017-05-24 DIAGNOSIS — Z7901 Long term (current) use of anticoagulants: Secondary | ICD-10-CM | POA: Diagnosis not present

## 2017-05-24 DIAGNOSIS — I10 Essential (primary) hypertension: Secondary | ICD-10-CM | POA: Diagnosis not present

## 2017-05-24 DIAGNOSIS — E1142 Type 2 diabetes mellitus with diabetic polyneuropathy: Secondary | ICD-10-CM | POA: Diagnosis not present

## 2017-05-24 DIAGNOSIS — I272 Pulmonary hypertension, unspecified: Secondary | ICD-10-CM | POA: Diagnosis not present

## 2017-05-24 DIAGNOSIS — E1165 Type 2 diabetes mellitus with hyperglycemia: Secondary | ICD-10-CM | POA: Diagnosis not present

## 2017-05-24 DIAGNOSIS — Z87891 Personal history of nicotine dependence: Secondary | ICD-10-CM | POA: Diagnosis not present

## 2017-06-14 DIAGNOSIS — I10 Essential (primary) hypertension: Secondary | ICD-10-CM | POA: Diagnosis not present

## 2017-06-14 DIAGNOSIS — I455 Other specified heart block: Secondary | ICD-10-CM | POA: Diagnosis not present

## 2017-06-14 DIAGNOSIS — I2510 Atherosclerotic heart disease of native coronary artery without angina pectoris: Secondary | ICD-10-CM | POA: Diagnosis not present

## 2017-06-14 DIAGNOSIS — I2699 Other pulmonary embolism without acute cor pulmonale: Secondary | ICD-10-CM | POA: Diagnosis not present

## 2017-07-21 DIAGNOSIS — E7211 Homocystinuria: Secondary | ICD-10-CM | POA: Diagnosis not present

## 2017-07-21 DIAGNOSIS — G458 Other transient cerebral ischemic attacks and related syndromes: Secondary | ICD-10-CM | POA: Diagnosis not present

## 2017-07-21 DIAGNOSIS — E1142 Type 2 diabetes mellitus with diabetic polyneuropathy: Secondary | ICD-10-CM | POA: Diagnosis not present

## 2017-07-21 DIAGNOSIS — I2699 Other pulmonary embolism without acute cor pulmonale: Secondary | ICD-10-CM | POA: Diagnosis not present

## 2017-08-07 DIAGNOSIS — R000 Tachycardia, unspecified: Secondary | ICD-10-CM | POA: Diagnosis not present

## 2017-08-07 DIAGNOSIS — I2510 Atherosclerotic heart disease of native coronary artery without angina pectoris: Secondary | ICD-10-CM | POA: Diagnosis not present

## 2017-08-07 DIAGNOSIS — Z87891 Personal history of nicotine dependence: Secondary | ICD-10-CM | POA: Diagnosis not present

## 2017-08-07 DIAGNOSIS — Z7901 Long term (current) use of anticoagulants: Secondary | ICD-10-CM | POA: Diagnosis not present

## 2017-08-07 DIAGNOSIS — E119 Type 2 diabetes mellitus without complications: Secondary | ICD-10-CM | POA: Diagnosis not present

## 2017-08-07 DIAGNOSIS — E1142 Type 2 diabetes mellitus with diabetic polyneuropathy: Secondary | ICD-10-CM | POA: Diagnosis not present

## 2017-08-07 DIAGNOSIS — N281 Cyst of kidney, acquired: Secondary | ICD-10-CM | POA: Diagnosis not present

## 2017-08-07 DIAGNOSIS — K921 Melena: Secondary | ICD-10-CM | POA: Diagnosis not present

## 2017-08-07 DIAGNOSIS — K5730 Diverticulosis of large intestine without perforation or abscess without bleeding: Secondary | ICD-10-CM | POA: Diagnosis not present

## 2017-08-07 DIAGNOSIS — I129 Hypertensive chronic kidney disease with stage 1 through stage 4 chronic kidney disease, or unspecified chronic kidney disease: Secondary | ICD-10-CM | POA: Diagnosis not present

## 2017-08-07 DIAGNOSIS — Z86718 Personal history of other venous thrombosis and embolism: Secondary | ICD-10-CM | POA: Diagnosis not present

## 2017-08-07 DIAGNOSIS — R1030 Lower abdominal pain, unspecified: Secondary | ICD-10-CM | POA: Diagnosis not present

## 2017-08-07 DIAGNOSIS — Z86711 Personal history of pulmonary embolism: Secondary | ICD-10-CM | POA: Diagnosis not present

## 2017-08-07 DIAGNOSIS — E785 Hyperlipidemia, unspecified: Secondary | ICD-10-CM | POA: Diagnosis not present

## 2017-08-07 DIAGNOSIS — N183 Chronic kidney disease, stage 3 (moderate): Secondary | ICD-10-CM | POA: Diagnosis not present

## 2017-08-07 DIAGNOSIS — K922 Gastrointestinal hemorrhage, unspecified: Secondary | ICD-10-CM | POA: Diagnosis not present

## 2017-08-07 DIAGNOSIS — I455 Other specified heart block: Secondary | ICD-10-CM | POA: Diagnosis not present

## 2017-08-07 DIAGNOSIS — Z955 Presence of coronary angioplasty implant and graft: Secondary | ICD-10-CM | POA: Diagnosis not present

## 2017-08-07 DIAGNOSIS — I10 Essential (primary) hypertension: Secondary | ICD-10-CM | POA: Diagnosis not present

## 2017-08-07 DIAGNOSIS — E1122 Type 2 diabetes mellitus with diabetic chronic kidney disease: Secondary | ICD-10-CM | POA: Diagnosis not present

## 2017-08-07 DIAGNOSIS — Z86010 Personal history of colonic polyps: Secondary | ICD-10-CM | POA: Diagnosis not present

## 2017-08-07 DIAGNOSIS — Z794 Long term (current) use of insulin: Secondary | ICD-10-CM | POA: Diagnosis not present

## 2017-08-08 DIAGNOSIS — I2510 Atherosclerotic heart disease of native coronary artery without angina pectoris: Secondary | ICD-10-CM | POA: Diagnosis not present

## 2017-08-08 DIAGNOSIS — E1142 Type 2 diabetes mellitus with diabetic polyneuropathy: Secondary | ICD-10-CM | POA: Diagnosis not present

## 2017-08-08 DIAGNOSIS — Z86718 Personal history of other venous thrombosis and embolism: Secondary | ICD-10-CM | POA: Diagnosis not present

## 2017-08-08 DIAGNOSIS — Z86010 Personal history of colonic polyps: Secondary | ICD-10-CM | POA: Diagnosis not present

## 2017-08-08 DIAGNOSIS — K921 Melena: Secondary | ICD-10-CM | POA: Diagnosis not present

## 2017-08-08 DIAGNOSIS — Z86711 Personal history of pulmonary embolism: Secondary | ICD-10-CM | POA: Diagnosis not present

## 2017-08-08 DIAGNOSIS — E1122 Type 2 diabetes mellitus with diabetic chronic kidney disease: Secondary | ICD-10-CM | POA: Diagnosis not present

## 2017-08-08 DIAGNOSIS — Z955 Presence of coronary angioplasty implant and graft: Secondary | ICD-10-CM | POA: Diagnosis not present

## 2017-08-08 DIAGNOSIS — Z7901 Long term (current) use of anticoagulants: Secondary | ICD-10-CM | POA: Diagnosis not present

## 2017-08-08 DIAGNOSIS — K922 Gastrointestinal hemorrhage, unspecified: Secondary | ICD-10-CM | POA: Diagnosis not present

## 2017-08-08 DIAGNOSIS — Z794 Long term (current) use of insulin: Secondary | ICD-10-CM | POA: Diagnosis not present

## 2017-08-08 DIAGNOSIS — I10 Essential (primary) hypertension: Secondary | ICD-10-CM | POA: Diagnosis not present

## 2017-08-09 DIAGNOSIS — Z86718 Personal history of other venous thrombosis and embolism: Secondary | ICD-10-CM | POA: Diagnosis not present

## 2017-08-09 DIAGNOSIS — I10 Essential (primary) hypertension: Secondary | ICD-10-CM | POA: Diagnosis not present

## 2017-08-09 DIAGNOSIS — K922 Gastrointestinal hemorrhage, unspecified: Secondary | ICD-10-CM | POA: Diagnosis not present

## 2017-08-09 DIAGNOSIS — Z86711 Personal history of pulmonary embolism: Secondary | ICD-10-CM | POA: Diagnosis not present

## 2017-08-09 DIAGNOSIS — K921 Melena: Secondary | ICD-10-CM | POA: Diagnosis not present

## 2017-08-09 DIAGNOSIS — Z955 Presence of coronary angioplasty implant and graft: Secondary | ICD-10-CM | POA: Diagnosis not present

## 2017-08-09 DIAGNOSIS — I455 Other specified heart block: Secondary | ICD-10-CM | POA: Diagnosis not present

## 2017-08-09 DIAGNOSIS — E1142 Type 2 diabetes mellitus with diabetic polyneuropathy: Secondary | ICD-10-CM | POA: Diagnosis not present

## 2017-08-09 DIAGNOSIS — I2510 Atherosclerotic heart disease of native coronary artery without angina pectoris: Secondary | ICD-10-CM | POA: Diagnosis not present

## 2017-08-09 DIAGNOSIS — Z7901 Long term (current) use of anticoagulants: Secondary | ICD-10-CM | POA: Diagnosis not present

## 2017-08-09 DIAGNOSIS — Z86010 Personal history of colonic polyps: Secondary | ICD-10-CM | POA: Diagnosis not present

## 2017-08-09 DIAGNOSIS — E1122 Type 2 diabetes mellitus with diabetic chronic kidney disease: Secondary | ICD-10-CM | POA: Diagnosis not present

## 2017-08-10 DIAGNOSIS — Z955 Presence of coronary angioplasty implant and graft: Secondary | ICD-10-CM | POA: Diagnosis not present

## 2017-08-10 DIAGNOSIS — I2510 Atherosclerotic heart disease of native coronary artery without angina pectoris: Secondary | ICD-10-CM | POA: Diagnosis not present

## 2017-08-10 DIAGNOSIS — K922 Gastrointestinal hemorrhage, unspecified: Secondary | ICD-10-CM | POA: Diagnosis not present

## 2017-08-10 DIAGNOSIS — Z7901 Long term (current) use of anticoagulants: Secondary | ICD-10-CM | POA: Diagnosis not present

## 2017-08-10 DIAGNOSIS — R42 Dizziness and giddiness: Secondary | ICD-10-CM | POA: Diagnosis not present

## 2017-08-10 DIAGNOSIS — E1142 Type 2 diabetes mellitus with diabetic polyneuropathy: Secondary | ICD-10-CM | POA: Diagnosis not present

## 2017-08-10 DIAGNOSIS — Z86718 Personal history of other venous thrombosis and embolism: Secondary | ICD-10-CM | POA: Diagnosis not present

## 2017-08-10 DIAGNOSIS — E1122 Type 2 diabetes mellitus with diabetic chronic kidney disease: Secondary | ICD-10-CM | POA: Diagnosis not present

## 2017-08-10 DIAGNOSIS — I10 Essential (primary) hypertension: Secondary | ICD-10-CM | POA: Diagnosis not present

## 2017-08-10 DIAGNOSIS — Z86711 Personal history of pulmonary embolism: Secondary | ICD-10-CM | POA: Diagnosis not present

## 2017-08-10 DIAGNOSIS — I455 Other specified heart block: Secondary | ICD-10-CM | POA: Diagnosis not present

## 2017-08-16 DIAGNOSIS — I455 Other specified heart block: Secondary | ICD-10-CM | POA: Diagnosis not present

## 2017-08-16 DIAGNOSIS — I2510 Atherosclerotic heart disease of native coronary artery without angina pectoris: Secondary | ICD-10-CM | POA: Diagnosis not present

## 2017-08-22 DIAGNOSIS — Z09 Encounter for follow-up examination after completed treatment for conditions other than malignant neoplasm: Secondary | ICD-10-CM | POA: Diagnosis not present

## 2017-08-22 DIAGNOSIS — R7989 Other specified abnormal findings of blood chemistry: Secondary | ICD-10-CM | POA: Diagnosis not present

## 2017-08-22 DIAGNOSIS — I455 Other specified heart block: Secondary | ICD-10-CM | POA: Diagnosis not present

## 2017-08-22 DIAGNOSIS — I10 Essential (primary) hypertension: Secondary | ICD-10-CM | POA: Diagnosis not present

## 2017-08-22 DIAGNOSIS — K922 Gastrointestinal hemorrhage, unspecified: Secondary | ICD-10-CM | POA: Diagnosis not present

## 2017-08-22 DIAGNOSIS — I2510 Atherosclerotic heart disease of native coronary artery without angina pectoris: Secondary | ICD-10-CM | POA: Diagnosis not present

## 2017-08-22 DIAGNOSIS — E1122 Type 2 diabetes mellitus with diabetic chronic kidney disease: Secondary | ICD-10-CM | POA: Diagnosis not present

## 2017-08-22 DIAGNOSIS — E785 Hyperlipidemia, unspecified: Secondary | ICD-10-CM | POA: Diagnosis not present

## 2017-08-22 DIAGNOSIS — K219 Gastro-esophageal reflux disease without esophagitis: Secondary | ICD-10-CM | POA: Diagnosis not present

## 2017-08-22 DIAGNOSIS — M8580 Other specified disorders of bone density and structure, unspecified site: Secondary | ICD-10-CM | POA: Diagnosis not present

## 2017-08-22 DIAGNOSIS — E559 Vitamin D deficiency, unspecified: Secondary | ICD-10-CM | POA: Diagnosis not present

## 2017-08-22 DIAGNOSIS — E1140 Type 2 diabetes mellitus with diabetic neuropathy, unspecified: Secondary | ICD-10-CM | POA: Diagnosis not present

## 2017-09-12 DIAGNOSIS — E875 Hyperkalemia: Secondary | ICD-10-CM | POA: Diagnosis not present

## 2017-10-11 IMAGING — DX DG LUMBAR SPINE COMPLETE 4+V
5 series · 5 of 5 positions shown · non-contrast
Comparison: None in PACs

CLINICAL DATA: Mid to lower back pain for the past 1-2 weeks
without known injury.

EXAM:
LUMBAR SPINE - COMPLETE 4+ VIEW

[l-spine ap]
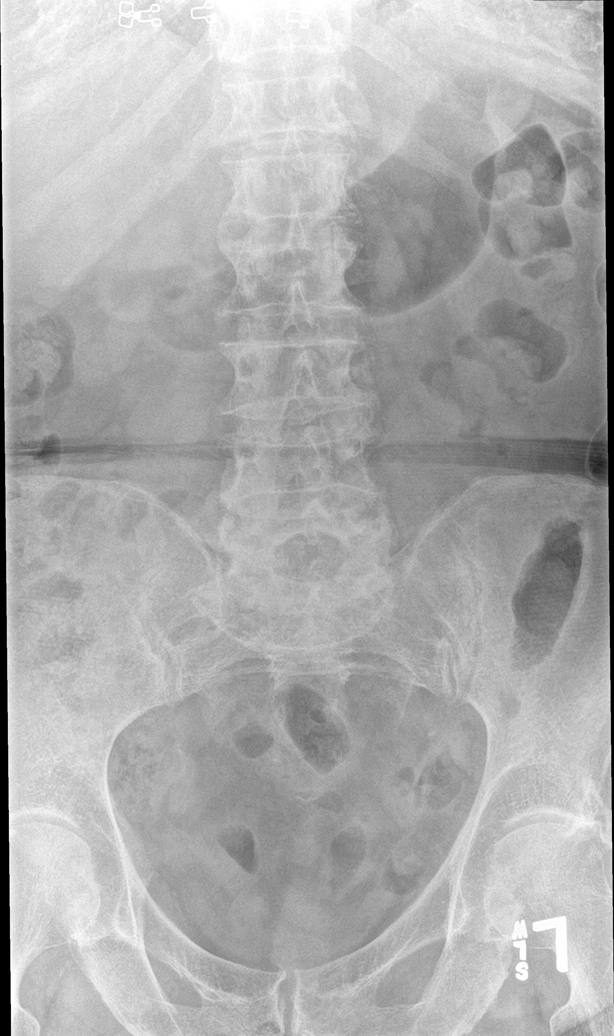

[l-spine obl (1 of 2)]
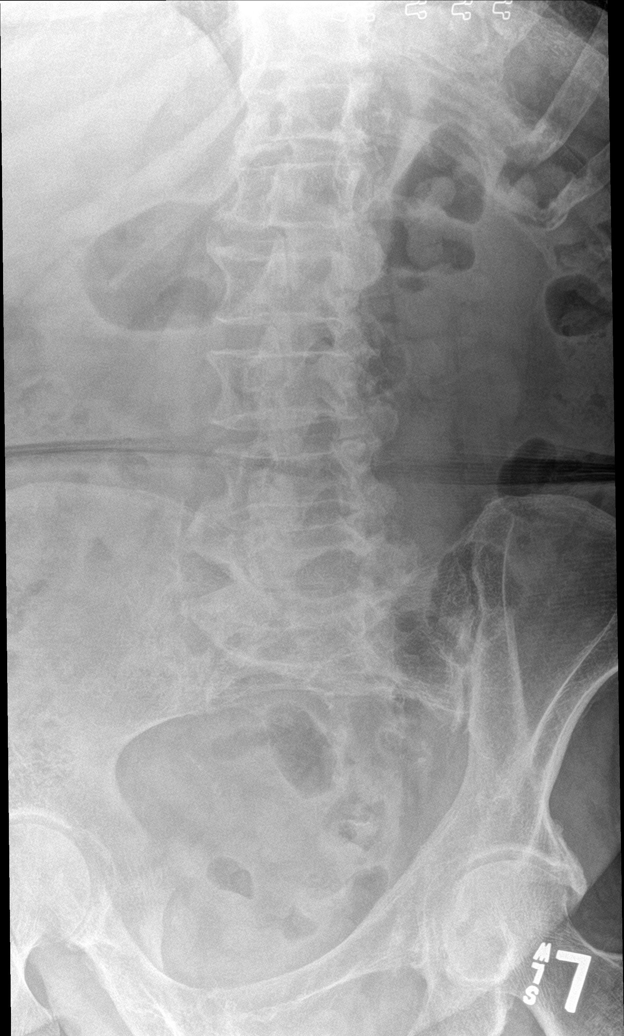

[l-spine obl (2 of 2)]
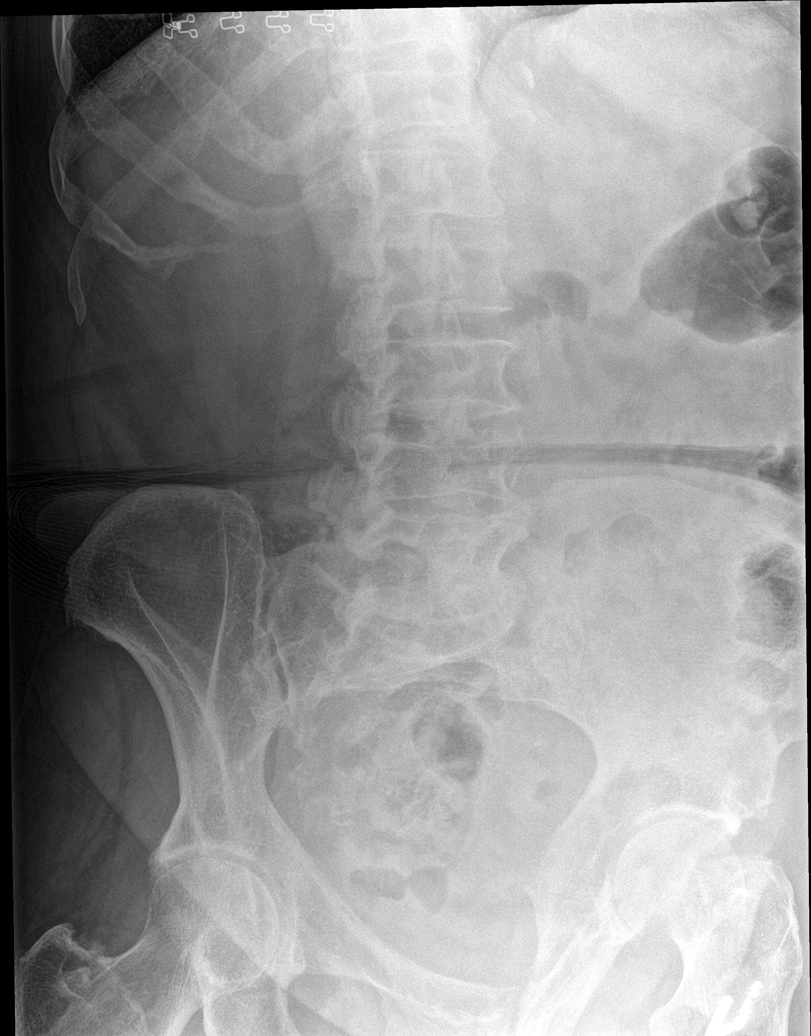

[l-spine lat]
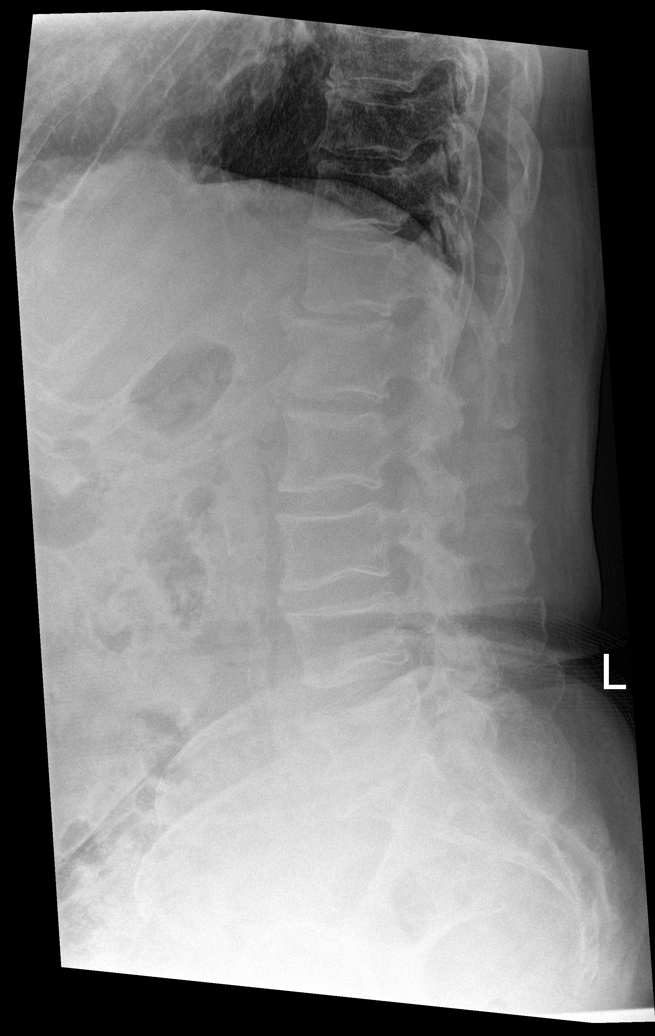

[l-spine spot]
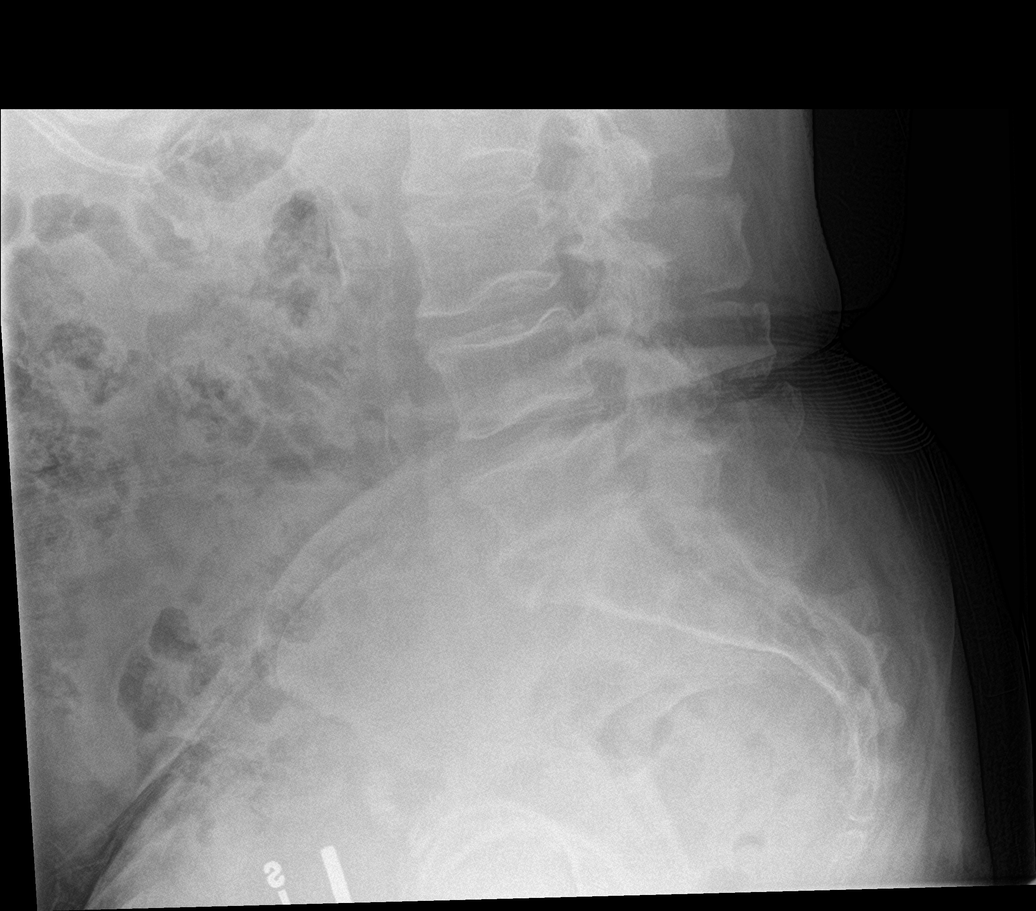

[5 of 5 positions shown; findings below may reference images not displayed]

FINDINGS: The lumbar vertebral bodies are preserved in height. There is mild
disc space narrowing at T12-L1, L1-L2, and L5-S1. There is no
spondylolisthesis. There is multilevel facet joint hypertrophy. The
pedicles and transverse processes are intact. There is a lateral
bridging osteophyte on the right at L1-2.
IMPRESSION: There is no compression fracture or spondylolisthesis. Mild
degenerative disc and facet joint change is present at multiple
levels.

## 2017-11-09 DIAGNOSIS — Z87891 Personal history of nicotine dependence: Secondary | ICD-10-CM | POA: Diagnosis not present

## 2017-11-09 DIAGNOSIS — M79602 Pain in left arm: Secondary | ICD-10-CM | POA: Diagnosis not present

## 2017-11-09 DIAGNOSIS — R079 Chest pain, unspecified: Secondary | ICD-10-CM | POA: Diagnosis not present

## 2017-11-09 DIAGNOSIS — Z86711 Personal history of pulmonary embolism: Secondary | ICD-10-CM | POA: Diagnosis not present

## 2017-11-09 DIAGNOSIS — R110 Nausea: Secondary | ICD-10-CM | POA: Diagnosis not present

## 2017-11-09 DIAGNOSIS — M25512 Pain in left shoulder: Secondary | ICD-10-CM | POA: Diagnosis not present

## 2017-11-09 DIAGNOSIS — I2699 Other pulmonary embolism without acute cor pulmonale: Secondary | ICD-10-CM | POA: Diagnosis not present

## 2017-11-09 DIAGNOSIS — Z888 Allergy status to other drugs, medicaments and biological substances status: Secondary | ICD-10-CM | POA: Diagnosis not present

## 2017-11-09 DIAGNOSIS — Z882 Allergy status to sulfonamides status: Secondary | ICD-10-CM | POA: Diagnosis not present

## 2017-11-09 DIAGNOSIS — Z7901 Long term (current) use of anticoagulants: Secondary | ICD-10-CM | POA: Diagnosis not present

## 2017-11-09 DIAGNOSIS — E119 Type 2 diabetes mellitus without complications: Secondary | ICD-10-CM | POA: Diagnosis not present

## 2017-11-09 DIAGNOSIS — I10 Essential (primary) hypertension: Secondary | ICD-10-CM | POA: Diagnosis not present

## 2017-11-09 DIAGNOSIS — Z880 Allergy status to penicillin: Secondary | ICD-10-CM | POA: Diagnosis not present

## 2017-11-09 DIAGNOSIS — R52 Pain, unspecified: Secondary | ICD-10-CM | POA: Diagnosis not present

## 2017-12-07 DIAGNOSIS — L608 Other nail disorders: Secondary | ICD-10-CM | POA: Diagnosis not present

## 2017-12-07 DIAGNOSIS — L600 Ingrowing nail: Secondary | ICD-10-CM | POA: Diagnosis not present

## 2017-12-07 DIAGNOSIS — E1140 Type 2 diabetes mellitus with diabetic neuropathy, unspecified: Secondary | ICD-10-CM | POA: Diagnosis not present

## 2017-12-07 DIAGNOSIS — M79676 Pain in unspecified toe(s): Secondary | ICD-10-CM | POA: Diagnosis not present

## 2017-12-08 DIAGNOSIS — L608 Other nail disorders: Secondary | ICD-10-CM | POA: Diagnosis not present

## 2017-12-08 DIAGNOSIS — E1122 Type 2 diabetes mellitus with diabetic chronic kidney disease: Secondary | ICD-10-CM | POA: Diagnosis not present

## 2017-12-08 DIAGNOSIS — M79676 Pain in unspecified toe(s): Secondary | ICD-10-CM | POA: Diagnosis not present

## 2017-12-08 DIAGNOSIS — L600 Ingrowing nail: Secondary | ICD-10-CM | POA: Diagnosis not present

## 2017-12-08 DIAGNOSIS — E1140 Type 2 diabetes mellitus with diabetic neuropathy, unspecified: Secondary | ICD-10-CM | POA: Diagnosis not present

## 2017-12-08 DIAGNOSIS — L602 Onychogryphosis: Secondary | ICD-10-CM | POA: Diagnosis not present

## 2017-12-12 DIAGNOSIS — R69 Illness, unspecified: Secondary | ICD-10-CM | POA: Diagnosis not present

## 2017-12-14 DIAGNOSIS — R7303 Prediabetes: Secondary | ICD-10-CM | POA: Diagnosis not present

## 2017-12-15 DIAGNOSIS — R69 Illness, unspecified: Secondary | ICD-10-CM | POA: Diagnosis not present

## 2017-12-26 DIAGNOSIS — R7309 Other abnormal glucose: Secondary | ICD-10-CM | POA: Diagnosis not present

## 2017-12-29 DIAGNOSIS — E785 Hyperlipidemia, unspecified: Secondary | ICD-10-CM | POA: Diagnosis not present

## 2017-12-29 DIAGNOSIS — I2510 Atherosclerotic heart disease of native coronary artery without angina pectoris: Secondary | ICD-10-CM | POA: Diagnosis not present

## 2017-12-29 DIAGNOSIS — I10 Essential (primary) hypertension: Secondary | ICD-10-CM | POA: Diagnosis not present

## 2017-12-29 DIAGNOSIS — N183 Chronic kidney disease, stage 3 (moderate): Secondary | ICD-10-CM | POA: Diagnosis not present

## 2018-01-08 DIAGNOSIS — M1990 Unspecified osteoarthritis, unspecified site: Secondary | ICD-10-CM | POA: Diagnosis not present

## 2018-01-08 DIAGNOSIS — E1142 Type 2 diabetes mellitus with diabetic polyneuropathy: Secondary | ICD-10-CM | POA: Diagnosis not present

## 2018-01-08 DIAGNOSIS — I2729 Other secondary pulmonary hypertension: Secondary | ICD-10-CM | POA: Diagnosis not present

## 2018-01-08 DIAGNOSIS — G458 Other transient cerebral ischemic attacks and related syndromes: Secondary | ICD-10-CM | POA: Diagnosis not present

## 2018-01-08 DIAGNOSIS — Z86718 Personal history of other venous thrombosis and embolism: Secondary | ICD-10-CM | POA: Diagnosis not present

## 2018-01-18 DIAGNOSIS — E1140 Type 2 diabetes mellitus with diabetic neuropathy, unspecified: Secondary | ICD-10-CM | POA: Diagnosis not present

## 2018-01-18 DIAGNOSIS — R0989 Other specified symptoms and signs involving the circulatory and respiratory systems: Secondary | ICD-10-CM | POA: Diagnosis not present

## 2018-01-18 DIAGNOSIS — R079 Chest pain, unspecified: Secondary | ICD-10-CM | POA: Diagnosis not present

## 2018-01-18 DIAGNOSIS — Z79899 Other long term (current) drug therapy: Secondary | ICD-10-CM | POA: Diagnosis not present

## 2018-01-18 DIAGNOSIS — R0789 Other chest pain: Secondary | ICD-10-CM | POA: Diagnosis not present

## 2018-01-18 DIAGNOSIS — R200 Anesthesia of skin: Secondary | ICD-10-CM | POA: Diagnosis not present

## 2018-01-18 DIAGNOSIS — R918 Other nonspecific abnormal finding of lung field: Secondary | ICD-10-CM | POA: Diagnosis not present

## 2018-01-18 DIAGNOSIS — Z7982 Long term (current) use of aspirin: Secondary | ICD-10-CM | POA: Diagnosis not present

## 2018-01-18 DIAGNOSIS — I160 Hypertensive urgency: Secondary | ICD-10-CM | POA: Diagnosis not present

## 2018-01-18 DIAGNOSIS — G9389 Other specified disorders of brain: Secondary | ICD-10-CM | POA: Diagnosis not present

## 2018-01-18 DIAGNOSIS — I2510 Atherosclerotic heart disease of native coronary artery without angina pectoris: Secondary | ICD-10-CM | POA: Diagnosis not present

## 2018-01-18 DIAGNOSIS — E782 Mixed hyperlipidemia: Secondary | ICD-10-CM | POA: Diagnosis not present

## 2018-01-18 DIAGNOSIS — R9082 White matter disease, unspecified: Secondary | ICD-10-CM | POA: Diagnosis not present

## 2018-01-18 DIAGNOSIS — G4489 Other headache syndrome: Secondary | ICD-10-CM | POA: Diagnosis not present

## 2018-01-18 DIAGNOSIS — G459 Transient cerebral ischemic attack, unspecified: Secondary | ICD-10-CM | POA: Diagnosis not present

## 2018-01-18 DIAGNOSIS — I10 Essential (primary) hypertension: Secondary | ICD-10-CM | POA: Diagnosis not present

## 2018-01-18 DIAGNOSIS — E1142 Type 2 diabetes mellitus with diabetic polyneuropathy: Secondary | ICD-10-CM | POA: Diagnosis not present

## 2018-01-18 DIAGNOSIS — R531 Weakness: Secondary | ICD-10-CM | POA: Diagnosis not present

## 2018-01-18 DIAGNOSIS — Z86711 Personal history of pulmonary embolism: Secondary | ICD-10-CM | POA: Diagnosis not present

## 2018-01-18 DIAGNOSIS — Z794 Long term (current) use of insulin: Secondary | ICD-10-CM | POA: Diagnosis not present

## 2018-01-19 DIAGNOSIS — I10 Essential (primary) hypertension: Secondary | ICD-10-CM | POA: Diagnosis not present

## 2018-01-19 DIAGNOSIS — Z86711 Personal history of pulmonary embolism: Secondary | ICD-10-CM | POA: Diagnosis not present

## 2018-01-19 DIAGNOSIS — Z8249 Family history of ischemic heart disease and other diseases of the circulatory system: Secondary | ICD-10-CM | POA: Diagnosis not present

## 2018-01-19 DIAGNOSIS — E7800 Pure hypercholesterolemia, unspecified: Secondary | ICD-10-CM | POA: Diagnosis not present

## 2018-01-19 DIAGNOSIS — E1142 Type 2 diabetes mellitus with diabetic polyneuropathy: Secondary | ICD-10-CM | POA: Diagnosis not present

## 2018-01-19 DIAGNOSIS — E119 Type 2 diabetes mellitus without complications: Secondary | ICD-10-CM | POA: Diagnosis not present

## 2018-01-19 DIAGNOSIS — I2510 Atherosclerotic heart disease of native coronary artery without angina pectoris: Secondary | ICD-10-CM | POA: Diagnosis not present

## 2018-01-19 DIAGNOSIS — R200 Anesthesia of skin: Secondary | ICD-10-CM | POA: Diagnosis not present

## 2018-01-19 DIAGNOSIS — R079 Chest pain, unspecified: Secondary | ICD-10-CM | POA: Diagnosis not present

## 2018-01-25 DIAGNOSIS — Z87891 Personal history of nicotine dependence: Secondary | ICD-10-CM | POA: Diagnosis not present

## 2018-01-25 DIAGNOSIS — Z880 Allergy status to penicillin: Secondary | ICD-10-CM | POA: Diagnosis not present

## 2018-01-25 DIAGNOSIS — Z7982 Long term (current) use of aspirin: Secondary | ICD-10-CM | POA: Diagnosis not present

## 2018-01-25 DIAGNOSIS — R531 Weakness: Secondary | ICD-10-CM | POA: Diagnosis not present

## 2018-01-25 DIAGNOSIS — E11649 Type 2 diabetes mellitus with hypoglycemia without coma: Secondary | ICD-10-CM | POA: Diagnosis not present

## 2018-01-25 DIAGNOSIS — Z886 Allergy status to analgesic agent status: Secondary | ICD-10-CM | POA: Diagnosis not present

## 2018-01-25 DIAGNOSIS — E162 Hypoglycemia, unspecified: Secondary | ICD-10-CM | POA: Diagnosis not present

## 2018-01-25 DIAGNOSIS — Z888 Allergy status to other drugs, medicaments and biological substances status: Secondary | ICD-10-CM | POA: Diagnosis not present

## 2018-01-25 DIAGNOSIS — I10 Essential (primary) hypertension: Secondary | ICD-10-CM | POA: Diagnosis not present

## 2018-01-25 DIAGNOSIS — R109 Unspecified abdominal pain: Secondary | ICD-10-CM | POA: Diagnosis not present

## 2018-01-25 DIAGNOSIS — E161 Other hypoglycemia: Secondary | ICD-10-CM | POA: Diagnosis not present

## 2018-01-25 DIAGNOSIS — Z885 Allergy status to narcotic agent status: Secondary | ICD-10-CM | POA: Diagnosis not present

## 2018-01-25 DIAGNOSIS — Z86711 Personal history of pulmonary embolism: Secondary | ICD-10-CM | POA: Diagnosis not present

## 2018-01-25 DIAGNOSIS — K449 Diaphragmatic hernia without obstruction or gangrene: Secondary | ICD-10-CM | POA: Diagnosis not present

## 2018-01-25 DIAGNOSIS — R935 Abnormal findings on diagnostic imaging of other abdominal regions, including retroperitoneum: Secondary | ICD-10-CM | POA: Diagnosis not present

## 2018-01-25 DIAGNOSIS — K5730 Diverticulosis of large intestine without perforation or abscess without bleeding: Secondary | ICD-10-CM | POA: Diagnosis not present

## 2018-01-29 ENCOUNTER — Ambulatory Visit (INDEPENDENT_AMBULATORY_CARE_PROVIDER_SITE_OTHER): Payer: Medicare HMO | Admitting: Podiatry

## 2018-01-29 ENCOUNTER — Encounter: Payer: Self-pay | Admitting: Podiatry

## 2018-01-29 VITALS — BP 156/70 | HR 81 | Ht 64.0 in | Wt 159.0 lb

## 2018-01-29 DIAGNOSIS — M79672 Pain in left foot: Secondary | ICD-10-CM | POA: Diagnosis not present

## 2018-01-29 DIAGNOSIS — B351 Tinea unguium: Secondary | ICD-10-CM | POA: Diagnosis not present

## 2018-01-29 DIAGNOSIS — M79671 Pain in right foot: Secondary | ICD-10-CM

## 2018-01-29 NOTE — Progress Notes (Signed)
SUBJECTIVE: 84 y.o. year old diabetic female presents complaining of painful nails and requesting toe nails cut. Patient was accompanied by her daughter. Been diabetic since 1993 and this morning BS was 89.  Review of Systems  Constitutional: Negative.   HENT: Negative.   Eyes: Negative.   Respiratory: Negative.   Cardiovascular:       Recent history of TIA and being monitored.  Gastrointestinal: Negative.   Genitourinary: Negative.   Skin: Negative.      OBJECTIVE: DERMATOLOGIC EXAMINATION: Thick dystrophic nails with fungal debris 1-5 bilateral.  VASCULAR EXAMINATION OF LOWER LIMBS: Pedal pulses are palpable on right foot and not palpable on left. Capillary Filling times within 3 seconds in all digits.  No edema or erythema noted. Temperature gradient from tibial crest to dorsum of foot is within normal bilateral.  NEUROLOGIC EXAMINATION OF THE LOWER LIMBS: Achilles DTR is present and within normal. Monofilament (Semmes-Weinstein 10-gm) sensory testing positive 5 out of 6, bilateral. Sharp and Dull discriminatory sensations at the plantar ball of hallux is intact bilateral.   MUSCULOSKELETAL EXAMINATION: No gross deformities.  ASSESSMENT: Onychomycosis. Painful nails bilateral. Controlled diabetic.  PLAN: Reviewed findings and available treatment options, periodic debridement. All nails debrided. May return in 3 months.

## 2018-01-29 NOTE — Patient Instructions (Signed)
Seen for hypertrophic nails. All nails debrided. Return in 3 months or sooner if needed.  

## 2018-02-01 DIAGNOSIS — R200 Anesthesia of skin: Secondary | ICD-10-CM | POA: Diagnosis not present

## 2018-02-01 DIAGNOSIS — E1122 Type 2 diabetes mellitus with diabetic chronic kidney disease: Secondary | ICD-10-CM | POA: Diagnosis not present

## 2018-02-01 DIAGNOSIS — I10 Essential (primary) hypertension: Secondary | ICD-10-CM | POA: Diagnosis not present

## 2018-02-01 DIAGNOSIS — E785 Hyperlipidemia, unspecified: Secondary | ICD-10-CM | POA: Diagnosis not present

## 2018-02-01 DIAGNOSIS — Z09 Encounter for follow-up examination after completed treatment for conditions other than malignant neoplasm: Secondary | ICD-10-CM | POA: Diagnosis not present

## 2018-02-01 DIAGNOSIS — N183 Chronic kidney disease, stage 3 (moderate): Secondary | ICD-10-CM | POA: Diagnosis not present

## 2018-02-01 DIAGNOSIS — R079 Chest pain, unspecified: Secondary | ICD-10-CM | POA: Diagnosis not present

## 2018-02-06 DIAGNOSIS — Z961 Presence of intraocular lens: Secondary | ICD-10-CM | POA: Diagnosis not present

## 2018-02-06 DIAGNOSIS — H401132 Primary open-angle glaucoma, bilateral, moderate stage: Secondary | ICD-10-CM | POA: Diagnosis not present

## 2018-02-06 DIAGNOSIS — H527 Unspecified disorder of refraction: Secondary | ICD-10-CM | POA: Diagnosis not present

## 2018-02-06 DIAGNOSIS — E119 Type 2 diabetes mellitus without complications: Secondary | ICD-10-CM | POA: Diagnosis not present

## 2018-02-06 DIAGNOSIS — R69 Illness, unspecified: Secondary | ICD-10-CM | POA: Diagnosis not present

## 2018-02-12 DIAGNOSIS — R69 Illness, unspecified: Secondary | ICD-10-CM | POA: Diagnosis not present

## 2018-02-14 DIAGNOSIS — R69 Illness, unspecified: Secondary | ICD-10-CM | POA: Diagnosis not present

## 2018-02-15 DIAGNOSIS — I2510 Atherosclerotic heart disease of native coronary artery without angina pectoris: Secondary | ICD-10-CM | POA: Diagnosis not present

## 2018-02-15 DIAGNOSIS — Z955 Presence of coronary angioplasty implant and graft: Secondary | ICD-10-CM | POA: Diagnosis not present

## 2018-02-15 DIAGNOSIS — I455 Other specified heart block: Secondary | ICD-10-CM | POA: Diagnosis not present

## 2018-03-09 DIAGNOSIS — S90416A Abrasion, unspecified lesser toe(s), initial encounter: Secondary | ICD-10-CM | POA: Diagnosis not present

## 2018-03-09 DIAGNOSIS — Z79899 Other long term (current) drug therapy: Secondary | ICD-10-CM | POA: Diagnosis not present

## 2018-03-09 DIAGNOSIS — S99921A Unspecified injury of right foot, initial encounter: Secondary | ICD-10-CM | POA: Diagnosis not present

## 2018-03-09 DIAGNOSIS — S90414A Abrasion, right lesser toe(s), initial encounter: Secondary | ICD-10-CM | POA: Diagnosis not present

## 2018-03-09 DIAGNOSIS — Z7982 Long term (current) use of aspirin: Secondary | ICD-10-CM | POA: Diagnosis not present

## 2018-03-09 DIAGNOSIS — Z886 Allergy status to analgesic agent status: Secondary | ICD-10-CM | POA: Diagnosis not present

## 2018-03-09 DIAGNOSIS — Z23 Encounter for immunization: Secondary | ICD-10-CM | POA: Diagnosis not present

## 2018-03-09 DIAGNOSIS — Z7901 Long term (current) use of anticoagulants: Secondary | ICD-10-CM | POA: Diagnosis not present

## 2018-03-09 DIAGNOSIS — Z885 Allergy status to narcotic agent status: Secondary | ICD-10-CM | POA: Diagnosis not present

## 2018-03-09 DIAGNOSIS — M7731 Calcaneal spur, right foot: Secondary | ICD-10-CM | POA: Diagnosis not present

## 2018-03-09 DIAGNOSIS — W208XXA Other cause of strike by thrown, projected or falling object, initial encounter: Secondary | ICD-10-CM | POA: Diagnosis not present

## 2018-03-09 DIAGNOSIS — S9031XA Contusion of right foot, initial encounter: Secondary | ICD-10-CM | POA: Diagnosis not present

## 2018-03-09 DIAGNOSIS — Z7984 Long term (current) use of oral hypoglycemic drugs: Secondary | ICD-10-CM | POA: Diagnosis not present

## 2018-04-10 DIAGNOSIS — Z23 Encounter for immunization: Secondary | ICD-10-CM | POA: Diagnosis not present

## 2018-04-21 DIAGNOSIS — R69 Illness, unspecified: Secondary | ICD-10-CM | POA: Diagnosis not present

## 2018-04-27 DIAGNOSIS — R69 Illness, unspecified: Secondary | ICD-10-CM | POA: Diagnosis not present

## 2018-04-30 ENCOUNTER — Ambulatory Visit: Payer: Medicare HMO | Admitting: Podiatry

## 2018-04-30 DIAGNOSIS — M7731 Calcaneal spur, right foot: Secondary | ICD-10-CM | POA: Diagnosis not present

## 2018-04-30 DIAGNOSIS — M19071 Primary osteoarthritis, right ankle and foot: Secondary | ICD-10-CM | POA: Diagnosis not present

## 2018-04-30 DIAGNOSIS — M7741 Metatarsalgia, right foot: Secondary | ICD-10-CM | POA: Diagnosis not present

## 2018-04-30 DIAGNOSIS — M7751 Other enthesopathy of right foot: Secondary | ICD-10-CM | POA: Diagnosis not present

## 2018-04-30 DIAGNOSIS — M85871 Other specified disorders of bone density and structure, right ankle and foot: Secondary | ICD-10-CM | POA: Diagnosis not present

## 2018-05-15 DIAGNOSIS — I10 Essential (primary) hypertension: Secondary | ICD-10-CM | POA: Diagnosis not present

## 2018-05-15 DIAGNOSIS — M899 Disorder of bone, unspecified: Secondary | ICD-10-CM | POA: Diagnosis not present

## 2018-05-15 DIAGNOSIS — E785 Hyperlipidemia, unspecified: Secondary | ICD-10-CM | POA: Diagnosis not present

## 2018-05-15 DIAGNOSIS — Z0000 Encounter for general adult medical examination without abnormal findings: Secondary | ICD-10-CM | POA: Diagnosis not present

## 2018-05-15 DIAGNOSIS — R5383 Other fatigue: Secondary | ICD-10-CM | POA: Diagnosis not present

## 2018-05-15 DIAGNOSIS — M8580 Other specified disorders of bone density and structure, unspecified site: Secondary | ICD-10-CM | POA: Diagnosis not present

## 2018-05-15 DIAGNOSIS — E559 Vitamin D deficiency, unspecified: Secondary | ICD-10-CM | POA: Diagnosis not present

## 2018-05-15 DIAGNOSIS — Z1231 Encounter for screening mammogram for malignant neoplasm of breast: Secondary | ICD-10-CM | POA: Diagnosis not present

## 2018-05-15 DIAGNOSIS — N183 Chronic kidney disease, stage 3 (moderate): Secondary | ICD-10-CM | POA: Diagnosis not present

## 2018-05-15 DIAGNOSIS — E1122 Type 2 diabetes mellitus with diabetic chronic kidney disease: Secondary | ICD-10-CM | POA: Diagnosis not present

## 2018-05-28 DIAGNOSIS — Z7901 Long term (current) use of anticoagulants: Secondary | ICD-10-CM | POA: Diagnosis not present

## 2018-05-28 DIAGNOSIS — Z86711 Personal history of pulmonary embolism: Secondary | ICD-10-CM | POA: Diagnosis not present

## 2018-05-30 DIAGNOSIS — K5790 Diverticulosis of intestine, part unspecified, without perforation or abscess without bleeding: Secondary | ICD-10-CM | POA: Diagnosis not present

## 2018-05-30 DIAGNOSIS — Z86010 Personal history of colonic polyps: Secondary | ICD-10-CM | POA: Diagnosis not present

## 2018-05-30 DIAGNOSIS — R1310 Dysphagia, unspecified: Secondary | ICD-10-CM | POA: Diagnosis not present

## 2018-06-02 DIAGNOSIS — Z1231 Encounter for screening mammogram for malignant neoplasm of breast: Secondary | ICD-10-CM | POA: Diagnosis not present

## 2018-06-26 DIAGNOSIS — M7751 Other enthesopathy of right foot: Secondary | ICD-10-CM | POA: Diagnosis not present

## 2018-06-26 DIAGNOSIS — M7752 Other enthesopathy of left foot: Secondary | ICD-10-CM | POA: Diagnosis not present

## 2018-06-26 DIAGNOSIS — B351 Tinea unguium: Secondary | ICD-10-CM | POA: Diagnosis not present

## 2018-06-26 DIAGNOSIS — E1142 Type 2 diabetes mellitus with diabetic polyneuropathy: Secondary | ICD-10-CM | POA: Diagnosis not present

## 2018-06-26 DIAGNOSIS — M722 Plantar fascial fibromatosis: Secondary | ICD-10-CM | POA: Diagnosis not present

## 2018-06-26 DIAGNOSIS — E1169 Type 2 diabetes mellitus with other specified complication: Secondary | ICD-10-CM | POA: Diagnosis not present

## 2018-07-23 DIAGNOSIS — H401132 Primary open-angle glaucoma, bilateral, moderate stage: Secondary | ICD-10-CM | POA: Diagnosis not present

## 2018-07-24 DIAGNOSIS — J9801 Acute bronchospasm: Secondary | ICD-10-CM | POA: Diagnosis not present

## 2018-07-24 DIAGNOSIS — J069 Acute upper respiratory infection, unspecified: Secondary | ICD-10-CM | POA: Diagnosis not present

## 2018-07-24 DIAGNOSIS — R300 Dysuria: Secondary | ICD-10-CM | POA: Diagnosis not present

## 2018-07-25 DIAGNOSIS — R300 Dysuria: Secondary | ICD-10-CM | POA: Diagnosis not present

## 2018-07-30 DIAGNOSIS — R69 Illness, unspecified: Secondary | ICD-10-CM | POA: Diagnosis not present

## 2018-07-31 DIAGNOSIS — R69 Illness, unspecified: Secondary | ICD-10-CM | POA: Diagnosis not present

## 2018-08-15 DIAGNOSIS — E1165 Type 2 diabetes mellitus with hyperglycemia: Secondary | ICD-10-CM | POA: Diagnosis not present

## 2018-08-15 DIAGNOSIS — Z880 Allergy status to penicillin: Secondary | ICD-10-CM | POA: Diagnosis not present

## 2018-08-15 DIAGNOSIS — R600 Localized edema: Secondary | ICD-10-CM | POA: Diagnosis not present

## 2018-08-15 DIAGNOSIS — I10 Essential (primary) hypertension: Secondary | ICD-10-CM | POA: Diagnosis not present

## 2018-08-15 DIAGNOSIS — E7800 Pure hypercholesterolemia, unspecified: Secondary | ICD-10-CM | POA: Diagnosis not present

## 2018-08-15 DIAGNOSIS — R202 Paresthesia of skin: Secondary | ICD-10-CM | POA: Diagnosis not present

## 2018-08-15 DIAGNOSIS — R29818 Other symptoms and signs involving the nervous system: Secondary | ICD-10-CM | POA: Diagnosis not present

## 2018-08-15 DIAGNOSIS — Z886 Allergy status to analgesic agent status: Secondary | ICD-10-CM | POA: Diagnosis not present

## 2018-08-15 DIAGNOSIS — R200 Anesthesia of skin: Secondary | ICD-10-CM | POA: Diagnosis not present

## 2018-08-15 DIAGNOSIS — Z87891 Personal history of nicotine dependence: Secondary | ICD-10-CM | POA: Diagnosis not present

## 2018-08-15 DIAGNOSIS — Z86711 Personal history of pulmonary embolism: Secondary | ICD-10-CM | POA: Diagnosis not present

## 2018-08-15 DIAGNOSIS — Z885 Allergy status to narcotic agent status: Secondary | ICD-10-CM | POA: Diagnosis not present

## 2018-08-15 DIAGNOSIS — I6501 Occlusion and stenosis of right vertebral artery: Secondary | ICD-10-CM | POA: Diagnosis not present

## 2018-08-15 DIAGNOSIS — E1140 Type 2 diabetes mellitus with diabetic neuropathy, unspecified: Secondary | ICD-10-CM | POA: Diagnosis not present

## 2018-08-23 DIAGNOSIS — E1122 Type 2 diabetes mellitus with diabetic chronic kidney disease: Secondary | ICD-10-CM | POA: Diagnosis not present

## 2018-08-23 DIAGNOSIS — N183 Chronic kidney disease, stage 3 (moderate): Secondary | ICD-10-CM | POA: Diagnosis not present

## 2018-08-23 DIAGNOSIS — E785 Hyperlipidemia, unspecified: Secondary | ICD-10-CM | POA: Diagnosis not present

## 2018-08-23 DIAGNOSIS — I10 Essential (primary) hypertension: Secondary | ICD-10-CM | POA: Diagnosis not present

## 2018-08-24 DIAGNOSIS — K5750 Diverticulosis of both small and large intestine without perforation or abscess without bleeding: Secondary | ICD-10-CM | POA: Diagnosis not present

## 2018-08-24 DIAGNOSIS — E785 Hyperlipidemia, unspecified: Secondary | ICD-10-CM | POA: Diagnosis not present

## 2018-08-24 DIAGNOSIS — N189 Chronic kidney disease, unspecified: Secondary | ICD-10-CM | POA: Diagnosis not present

## 2018-08-24 DIAGNOSIS — M2580 Other specified joint disorders, unspecified joint: Secondary | ICD-10-CM | POA: Diagnosis not present

## 2018-08-24 DIAGNOSIS — Z8679 Personal history of other diseases of the circulatory system: Secondary | ICD-10-CM | POA: Diagnosis not present

## 2018-08-24 DIAGNOSIS — R948 Abnormal results of function studies of other organs and systems: Secondary | ICD-10-CM | POA: Diagnosis not present

## 2018-08-24 DIAGNOSIS — Z7901 Long term (current) use of anticoagulants: Secondary | ICD-10-CM | POA: Diagnosis not present

## 2018-08-24 DIAGNOSIS — I129 Hypertensive chronic kidney disease with stage 1 through stage 4 chronic kidney disease, or unspecified chronic kidney disease: Secondary | ICD-10-CM | POA: Diagnosis not present

## 2018-10-12 DIAGNOSIS — R69 Illness, unspecified: Secondary | ICD-10-CM | POA: Diagnosis not present

## 2018-12-21 DIAGNOSIS — E1121 Type 2 diabetes mellitus with diabetic nephropathy: Secondary | ICD-10-CM | POA: Diagnosis not present

## 2018-12-21 DIAGNOSIS — R5383 Other fatigue: Secondary | ICD-10-CM | POA: Diagnosis not present

## 2018-12-21 DIAGNOSIS — E785 Hyperlipidemia, unspecified: Secondary | ICD-10-CM | POA: Diagnosis not present

## 2018-12-21 DIAGNOSIS — Z7901 Long term (current) use of anticoagulants: Secondary | ICD-10-CM | POA: Diagnosis not present

## 2018-12-21 DIAGNOSIS — I10 Essential (primary) hypertension: Secondary | ICD-10-CM | POA: Diagnosis not present

## 2018-12-21 DIAGNOSIS — N183 Chronic kidney disease, stage 3 (moderate): Secondary | ICD-10-CM | POA: Diagnosis not present

## 2019-01-01 DIAGNOSIS — E782 Mixed hyperlipidemia: Secondary | ICD-10-CM | POA: Diagnosis not present

## 2019-01-01 DIAGNOSIS — E1169 Type 2 diabetes mellitus with other specified complication: Secondary | ICD-10-CM | POA: Diagnosis not present

## 2019-01-01 DIAGNOSIS — I10 Essential (primary) hypertension: Secondary | ICD-10-CM | POA: Diagnosis not present

## 2019-01-01 DIAGNOSIS — I340 Nonrheumatic mitral (valve) insufficiency: Secondary | ICD-10-CM | POA: Diagnosis not present

## 2019-01-01 DIAGNOSIS — I2510 Atherosclerotic heart disease of native coronary artery without angina pectoris: Secondary | ICD-10-CM | POA: Diagnosis not present

## 2019-01-04 DIAGNOSIS — R69 Illness, unspecified: Secondary | ICD-10-CM | POA: Diagnosis not present

## 2019-01-05 DIAGNOSIS — R69 Illness, unspecified: Secondary | ICD-10-CM | POA: Diagnosis not present

## 2019-01-23 DIAGNOSIS — E7211 Homocystinuria: Secondary | ICD-10-CM | POA: Diagnosis not present

## 2019-01-23 DIAGNOSIS — N183 Chronic kidney disease, stage 3 (moderate): Secondary | ICD-10-CM | POA: Diagnosis not present

## 2019-01-23 DIAGNOSIS — G458 Other transient cerebral ischemic attacks and related syndromes: Secondary | ICD-10-CM | POA: Diagnosis not present

## 2019-01-23 DIAGNOSIS — I2729 Other secondary pulmonary hypertension: Secondary | ICD-10-CM | POA: Diagnosis not present

## 2019-01-23 DIAGNOSIS — E1142 Type 2 diabetes mellitus with diabetic polyneuropathy: Secondary | ICD-10-CM | POA: Diagnosis not present

## 2019-01-23 DIAGNOSIS — I2699 Other pulmonary embolism without acute cor pulmonale: Secondary | ICD-10-CM | POA: Diagnosis not present

## 2019-01-29 DIAGNOSIS — Z961 Presence of intraocular lens: Secondary | ICD-10-CM | POA: Diagnosis not present

## 2019-01-29 DIAGNOSIS — H401132 Primary open-angle glaucoma, bilateral, moderate stage: Secondary | ICD-10-CM | POA: Diagnosis not present

## 2019-02-11 DIAGNOSIS — H6091 Unspecified otitis externa, right ear: Secondary | ICD-10-CM | POA: Diagnosis not present

## 2019-02-11 DIAGNOSIS — M542 Cervicalgia: Secondary | ICD-10-CM | POA: Diagnosis not present

## 2019-03-05 DIAGNOSIS — Z7901 Long term (current) use of anticoagulants: Secondary | ICD-10-CM | POA: Diagnosis not present

## 2019-03-05 DIAGNOSIS — Z86711 Personal history of pulmonary embolism: Secondary | ICD-10-CM | POA: Diagnosis not present

## 2019-03-05 DIAGNOSIS — I10 Essential (primary) hypertension: Secondary | ICD-10-CM | POA: Diagnosis not present

## 2019-05-08 DIAGNOSIS — R69 Illness, unspecified: Secondary | ICD-10-CM | POA: Diagnosis not present

## 2019-05-09 DIAGNOSIS — R69 Illness, unspecified: Secondary | ICD-10-CM | POA: Diagnosis not present

## 2019-05-14 DIAGNOSIS — R5383 Other fatigue: Secondary | ICD-10-CM | POA: Diagnosis not present

## 2019-05-14 DIAGNOSIS — E785 Hyperlipidemia, unspecified: Secondary | ICD-10-CM | POA: Diagnosis not present

## 2019-05-14 DIAGNOSIS — Z23 Encounter for immunization: Secondary | ICD-10-CM | POA: Diagnosis not present

## 2019-05-14 DIAGNOSIS — N183 Chronic kidney disease, stage 3 unspecified: Secondary | ICD-10-CM | POA: Diagnosis not present

## 2019-05-14 DIAGNOSIS — M8580 Other specified disorders of bone density and structure, unspecified site: Secondary | ICD-10-CM | POA: Diagnosis not present

## 2019-05-14 DIAGNOSIS — E1122 Type 2 diabetes mellitus with diabetic chronic kidney disease: Secondary | ICD-10-CM | POA: Diagnosis not present

## 2019-05-14 DIAGNOSIS — M899 Disorder of bone, unspecified: Secondary | ICD-10-CM | POA: Diagnosis not present

## 2019-05-14 DIAGNOSIS — Z0000 Encounter for general adult medical examination without abnormal findings: Secondary | ICD-10-CM | POA: Diagnosis not present

## 2019-05-14 DIAGNOSIS — Z7901 Long term (current) use of anticoagulants: Secondary | ICD-10-CM | POA: Diagnosis not present

## 2019-05-14 DIAGNOSIS — J309 Allergic rhinitis, unspecified: Secondary | ICD-10-CM | POA: Diagnosis not present

## 2019-05-14 DIAGNOSIS — Z01419 Encounter for gynecological examination (general) (routine) without abnormal findings: Secondary | ICD-10-CM | POA: Diagnosis not present

## 2019-05-14 DIAGNOSIS — E559 Vitamin D deficiency, unspecified: Secondary | ICD-10-CM | POA: Diagnosis not present

## 2019-05-14 DIAGNOSIS — I10 Essential (primary) hypertension: Secondary | ICD-10-CM | POA: Diagnosis not present

## 2019-06-15 DIAGNOSIS — E1140 Type 2 diabetes mellitus with diabetic neuropathy, unspecified: Secondary | ICD-10-CM | POA: Diagnosis not present

## 2019-06-15 DIAGNOSIS — Z87891 Personal history of nicotine dependence: Secondary | ICD-10-CM | POA: Diagnosis not present

## 2019-06-15 DIAGNOSIS — Z7901 Long term (current) use of anticoagulants: Secondary | ICD-10-CM | POA: Diagnosis not present

## 2019-06-15 DIAGNOSIS — I10 Essential (primary) hypertension: Secondary | ICD-10-CM | POA: Diagnosis not present

## 2019-06-15 DIAGNOSIS — Z794 Long term (current) use of insulin: Secondary | ICD-10-CM | POA: Diagnosis not present

## 2019-06-15 DIAGNOSIS — E7800 Pure hypercholesterolemia, unspecified: Secondary | ICD-10-CM | POA: Diagnosis not present

## 2019-06-15 DIAGNOSIS — Z79899 Other long term (current) drug therapy: Secondary | ICD-10-CM | POA: Diagnosis not present

## 2019-06-15 DIAGNOSIS — Z86711 Personal history of pulmonary embolism: Secondary | ICD-10-CM | POA: Diagnosis not present

## 2019-06-15 DIAGNOSIS — H6991 Unspecified Eustachian tube disorder, right ear: Secondary | ICD-10-CM | POA: Diagnosis not present

## 2019-06-15 DIAGNOSIS — H9201 Otalgia, right ear: Secondary | ICD-10-CM | POA: Diagnosis not present

## 2019-06-29 DIAGNOSIS — E1159 Type 2 diabetes mellitus with other circulatory complications: Secondary | ICD-10-CM | POA: Diagnosis not present

## 2019-06-29 DIAGNOSIS — Z794 Long term (current) use of insulin: Secondary | ICD-10-CM | POA: Diagnosis not present

## 2019-06-29 DIAGNOSIS — J42 Unspecified chronic bronchitis: Secondary | ICD-10-CM | POA: Diagnosis not present

## 2019-06-29 DIAGNOSIS — E785 Hyperlipidemia, unspecified: Secondary | ICD-10-CM | POA: Diagnosis not present

## 2019-06-29 DIAGNOSIS — Z008 Encounter for other general examination: Secondary | ICD-10-CM | POA: Diagnosis not present

## 2019-06-29 DIAGNOSIS — I252 Old myocardial infarction: Secondary | ICD-10-CM | POA: Diagnosis not present

## 2019-06-29 DIAGNOSIS — E1165 Type 2 diabetes mellitus with hyperglycemia: Secondary | ICD-10-CM | POA: Diagnosis not present

## 2019-06-29 DIAGNOSIS — H409 Unspecified glaucoma: Secondary | ICD-10-CM | POA: Diagnosis not present

## 2019-06-29 DIAGNOSIS — E1142 Type 2 diabetes mellitus with diabetic polyneuropathy: Secondary | ICD-10-CM | POA: Diagnosis not present

## 2019-06-29 DIAGNOSIS — K219 Gastro-esophageal reflux disease without esophagitis: Secondary | ICD-10-CM | POA: Diagnosis not present

## 2019-06-29 DIAGNOSIS — I10 Essential (primary) hypertension: Secondary | ICD-10-CM | POA: Diagnosis not present

## 2019-07-02 DIAGNOSIS — I10 Essential (primary) hypertension: Secondary | ICD-10-CM | POA: Diagnosis not present

## 2019-07-02 DIAGNOSIS — Z86711 Personal history of pulmonary embolism: Secondary | ICD-10-CM | POA: Diagnosis not present

## 2019-07-02 DIAGNOSIS — I2510 Atherosclerotic heart disease of native coronary artery without angina pectoris: Secondary | ICD-10-CM | POA: Diagnosis not present

## 2019-08-01 DIAGNOSIS — I2729 Other secondary pulmonary hypertension: Secondary | ICD-10-CM | POA: Diagnosis not present

## 2019-08-01 DIAGNOSIS — R251 Tremor, unspecified: Secondary | ICD-10-CM | POA: Diagnosis not present

## 2019-08-01 DIAGNOSIS — E1142 Type 2 diabetes mellitus with diabetic polyneuropathy: Secondary | ICD-10-CM | POA: Diagnosis not present

## 2019-08-01 DIAGNOSIS — I2699 Other pulmonary embolism without acute cor pulmonale: Secondary | ICD-10-CM | POA: Diagnosis not present

## 2019-08-01 DIAGNOSIS — E7211 Homocystinuria: Secondary | ICD-10-CM | POA: Diagnosis not present

## 2019-08-05 DIAGNOSIS — R69 Illness, unspecified: Secondary | ICD-10-CM | POA: Diagnosis not present

## 2019-09-17 DIAGNOSIS — E889 Metabolic disorder, unspecified: Secondary | ICD-10-CM | POA: Diagnosis not present

## 2019-09-17 DIAGNOSIS — E785 Hyperlipidemia, unspecified: Secondary | ICD-10-CM | POA: Diagnosis not present

## 2019-09-17 DIAGNOSIS — E1169 Type 2 diabetes mellitus with other specified complication: Secondary | ICD-10-CM | POA: Diagnosis not present

## 2019-09-17 DIAGNOSIS — I10 Essential (primary) hypertension: Secondary | ICD-10-CM | POA: Diagnosis not present

## 2019-09-18 DIAGNOSIS — I2699 Other pulmonary embolism without acute cor pulmonale: Secondary | ICD-10-CM | POA: Diagnosis not present

## 2019-09-18 DIAGNOSIS — Z79899 Other long term (current) drug therapy: Secondary | ICD-10-CM | POA: Diagnosis not present

## 2019-09-18 DIAGNOSIS — I10 Essential (primary) hypertension: Secondary | ICD-10-CM | POA: Diagnosis not present

## 2019-09-18 DIAGNOSIS — Z7901 Long term (current) use of anticoagulants: Secondary | ICD-10-CM | POA: Diagnosis not present

## 2019-09-18 DIAGNOSIS — Z86711 Personal history of pulmonary embolism: Secondary | ICD-10-CM | POA: Diagnosis not present

## 2019-09-18 DIAGNOSIS — Z87891 Personal history of nicotine dependence: Secondary | ICD-10-CM | POA: Diagnosis not present

## 2019-09-18 DIAGNOSIS — Z7189 Other specified counseling: Secondary | ICD-10-CM | POA: Diagnosis not present

## 2019-09-24 DIAGNOSIS — E870 Hyperosmolality and hypernatremia: Secondary | ICD-10-CM | POA: Diagnosis not present

## 2019-09-24 DIAGNOSIS — R69 Illness, unspecified: Secondary | ICD-10-CM | POA: Diagnosis not present

## 2019-09-24 DIAGNOSIS — E119 Type 2 diabetes mellitus without complications: Secondary | ICD-10-CM | POA: Diagnosis not present

## 2019-09-24 DIAGNOSIS — R7989 Other specified abnormal findings of blood chemistry: Secondary | ICD-10-CM | POA: Diagnosis not present

## 2019-11-29 DIAGNOSIS — J449 Chronic obstructive pulmonary disease, unspecified: Secondary | ICD-10-CM | POA: Diagnosis not present

## 2019-11-29 DIAGNOSIS — I82409 Acute embolism and thrombosis of unspecified deep veins of unspecified lower extremity: Secondary | ICD-10-CM | POA: Diagnosis not present

## 2019-11-29 DIAGNOSIS — I4891 Unspecified atrial fibrillation: Secondary | ICD-10-CM | POA: Diagnosis not present

## 2019-11-29 DIAGNOSIS — E1142 Type 2 diabetes mellitus with diabetic polyneuropathy: Secondary | ICD-10-CM | POA: Diagnosis not present

## 2019-11-29 DIAGNOSIS — Z794 Long term (current) use of insulin: Secondary | ICD-10-CM | POA: Diagnosis not present

## 2019-11-29 DIAGNOSIS — E1159 Type 2 diabetes mellitus with other circulatory complications: Secondary | ICD-10-CM | POA: Diagnosis not present

## 2019-11-29 DIAGNOSIS — D6869 Other thrombophilia: Secondary | ICD-10-CM | POA: Diagnosis not present

## 2019-11-29 DIAGNOSIS — I2699 Other pulmonary embolism without acute cor pulmonale: Secondary | ICD-10-CM | POA: Diagnosis not present

## 2019-11-29 DIAGNOSIS — E785 Hyperlipidemia, unspecified: Secondary | ICD-10-CM | POA: Diagnosis not present

## 2019-11-29 DIAGNOSIS — H409 Unspecified glaucoma: Secondary | ICD-10-CM | POA: Diagnosis not present

## 2020-01-01 DIAGNOSIS — I10 Essential (primary) hypertension: Secondary | ICD-10-CM | POA: Diagnosis not present

## 2020-01-01 DIAGNOSIS — I2510 Atherosclerotic heart disease of native coronary artery without angina pectoris: Secondary | ICD-10-CM | POA: Diagnosis not present

## 2020-01-01 DIAGNOSIS — Z86711 Personal history of pulmonary embolism: Secondary | ICD-10-CM | POA: Diagnosis not present

## 2020-01-07 DIAGNOSIS — E1169 Type 2 diabetes mellitus with other specified complication: Secondary | ICD-10-CM | POA: Diagnosis not present

## 2020-01-07 DIAGNOSIS — I10 Essential (primary) hypertension: Secondary | ICD-10-CM | POA: Diagnosis not present

## 2020-01-07 DIAGNOSIS — E785 Hyperlipidemia, unspecified: Secondary | ICD-10-CM | POA: Diagnosis not present

## 2020-01-07 DIAGNOSIS — N183 Chronic kidney disease, stage 3 unspecified: Secondary | ICD-10-CM | POA: Diagnosis not present

## 2020-01-20 DIAGNOSIS — E119 Type 2 diabetes mellitus without complications: Secondary | ICD-10-CM | POA: Diagnosis not present

## 2020-01-20 DIAGNOSIS — H401132 Primary open-angle glaucoma, bilateral, moderate stage: Secondary | ICD-10-CM | POA: Diagnosis not present

## 2020-01-20 DIAGNOSIS — Z961 Presence of intraocular lens: Secondary | ICD-10-CM | POA: Diagnosis not present

## 2020-02-11 DIAGNOSIS — R9431 Abnormal electrocardiogram [ECG] [EKG]: Secondary | ICD-10-CM | POA: Diagnosis not present

## 2020-02-11 DIAGNOSIS — M79603 Pain in arm, unspecified: Secondary | ICD-10-CM | POA: Diagnosis not present

## 2020-02-11 DIAGNOSIS — Z79899 Other long term (current) drug therapy: Secondary | ICD-10-CM | POA: Diagnosis not present

## 2020-02-11 DIAGNOSIS — Z885 Allergy status to narcotic agent status: Secondary | ICD-10-CM | POA: Diagnosis not present

## 2020-02-11 DIAGNOSIS — Z86711 Personal history of pulmonary embolism: Secondary | ICD-10-CM | POA: Diagnosis not present

## 2020-02-11 DIAGNOSIS — M79601 Pain in right arm: Secondary | ICD-10-CM | POA: Diagnosis not present

## 2020-02-11 DIAGNOSIS — Z880 Allergy status to penicillin: Secondary | ICD-10-CM | POA: Diagnosis not present

## 2020-02-11 DIAGNOSIS — I10 Essential (primary) hypertension: Secondary | ICD-10-CM | POA: Diagnosis not present

## 2020-02-11 DIAGNOSIS — R52 Pain, unspecified: Secondary | ICD-10-CM | POA: Diagnosis not present

## 2020-02-11 DIAGNOSIS — Z87891 Personal history of nicotine dependence: Secondary | ICD-10-CM | POA: Diagnosis not present

## 2020-02-11 DIAGNOSIS — R079 Chest pain, unspecified: Secondary | ICD-10-CM | POA: Diagnosis not present

## 2020-02-11 DIAGNOSIS — Z794 Long term (current) use of insulin: Secondary | ICD-10-CM | POA: Diagnosis not present

## 2020-02-11 DIAGNOSIS — E7800 Pure hypercholesterolemia, unspecified: Secondary | ICD-10-CM | POA: Diagnosis not present

## 2020-02-11 DIAGNOSIS — E1140 Type 2 diabetes mellitus with diabetic neuropathy, unspecified: Secondary | ICD-10-CM | POA: Diagnosis not present

## 2020-02-11 DIAGNOSIS — M25519 Pain in unspecified shoulder: Secondary | ICD-10-CM | POA: Diagnosis not present

## 2020-02-11 DIAGNOSIS — Z7901 Long term (current) use of anticoagulants: Secondary | ICD-10-CM | POA: Diagnosis not present

## 2020-02-18 DIAGNOSIS — E1142 Type 2 diabetes mellitus with diabetic polyneuropathy: Secondary | ICD-10-CM | POA: Diagnosis not present

## 2020-02-18 DIAGNOSIS — N183 Chronic kidney disease, stage 3 unspecified: Secondary | ICD-10-CM | POA: Diagnosis not present

## 2020-02-18 DIAGNOSIS — Z794 Long term (current) use of insulin: Secondary | ICD-10-CM | POA: Diagnosis not present

## 2020-03-20 DIAGNOSIS — E7800 Pure hypercholesterolemia, unspecified: Secondary | ICD-10-CM | POA: Diagnosis not present

## 2020-03-20 DIAGNOSIS — Z7901 Long term (current) use of anticoagulants: Secondary | ICD-10-CM | POA: Diagnosis not present

## 2020-03-20 DIAGNOSIS — Z90710 Acquired absence of both cervix and uterus: Secondary | ICD-10-CM | POA: Diagnosis not present

## 2020-03-20 DIAGNOSIS — E1140 Type 2 diabetes mellitus with diabetic neuropathy, unspecified: Secondary | ICD-10-CM | POA: Diagnosis not present

## 2020-03-20 DIAGNOSIS — E663 Overweight: Secondary | ICD-10-CM | POA: Diagnosis not present

## 2020-03-20 DIAGNOSIS — Z86711 Personal history of pulmonary embolism: Secondary | ICD-10-CM | POA: Diagnosis not present

## 2020-03-20 DIAGNOSIS — Z79899 Other long term (current) drug therapy: Secondary | ICD-10-CM | POA: Diagnosis not present

## 2020-03-20 DIAGNOSIS — I10 Essential (primary) hypertension: Secondary | ICD-10-CM | POA: Diagnosis not present

## 2020-03-20 DIAGNOSIS — Z86718 Personal history of other venous thrombosis and embolism: Secondary | ICD-10-CM | POA: Diagnosis not present

## 2020-03-20 DIAGNOSIS — Z09 Encounter for follow-up examination after completed treatment for conditions other than malignant neoplasm: Secondary | ICD-10-CM | POA: Diagnosis not present

## 2020-03-20 DIAGNOSIS — Z794 Long term (current) use of insulin: Secondary | ICD-10-CM | POA: Diagnosis not present

## 2020-04-22 DIAGNOSIS — R69 Illness, unspecified: Secondary | ICD-10-CM | POA: Diagnosis not present

## 2020-04-24 DIAGNOSIS — Z7901 Long term (current) use of anticoagulants: Secondary | ICD-10-CM | POA: Diagnosis not present

## 2020-04-24 DIAGNOSIS — N183 Chronic kidney disease, stage 3 unspecified: Secondary | ICD-10-CM | POA: Diagnosis not present

## 2020-04-24 DIAGNOSIS — E785 Hyperlipidemia, unspecified: Secondary | ICD-10-CM | POA: Diagnosis not present

## 2020-04-24 DIAGNOSIS — I10 Essential (primary) hypertension: Secondary | ICD-10-CM | POA: Diagnosis not present

## 2020-04-24 DIAGNOSIS — M899 Disorder of bone, unspecified: Secondary | ICD-10-CM | POA: Diagnosis not present

## 2020-04-24 DIAGNOSIS — M8580 Other specified disorders of bone density and structure, unspecified site: Secondary | ICD-10-CM | POA: Diagnosis not present

## 2020-04-24 DIAGNOSIS — E559 Vitamin D deficiency, unspecified: Secondary | ICD-10-CM | POA: Diagnosis not present

## 2020-04-24 DIAGNOSIS — R5383 Other fatigue: Secondary | ICD-10-CM | POA: Diagnosis not present

## 2020-04-24 DIAGNOSIS — E1169 Type 2 diabetes mellitus with other specified complication: Secondary | ICD-10-CM | POA: Diagnosis not present

## 2020-05-01 DIAGNOSIS — R69 Illness, unspecified: Secondary | ICD-10-CM | POA: Diagnosis not present

## 2020-06-02 DIAGNOSIS — N644 Mastodynia: Secondary | ICD-10-CM | POA: Diagnosis not present

## 2020-06-02 DIAGNOSIS — N6459 Other signs and symptoms in breast: Secondary | ICD-10-CM | POA: Diagnosis not present

## 2020-06-02 DIAGNOSIS — R928 Other abnormal and inconclusive findings on diagnostic imaging of breast: Secondary | ICD-10-CM | POA: Diagnosis not present

## 2020-06-29 DIAGNOSIS — R69 Illness, unspecified: Secondary | ICD-10-CM | POA: Diagnosis not present

## 2020-07-05 DIAGNOSIS — G4489 Other headache syndrome: Secondary | ICD-10-CM | POA: Diagnosis not present

## 2020-07-05 DIAGNOSIS — M542 Cervicalgia: Secondary | ICD-10-CM | POA: Diagnosis not present

## 2020-07-05 DIAGNOSIS — R519 Headache, unspecified: Secondary | ICD-10-CM | POA: Diagnosis not present

## 2020-07-05 DIAGNOSIS — R471 Dysarthria and anarthria: Secondary | ICD-10-CM | POA: Diagnosis not present

## 2020-07-05 DIAGNOSIS — R4781 Slurred speech: Secondary | ICD-10-CM | POA: Diagnosis not present

## 2020-07-05 DIAGNOSIS — I272 Pulmonary hypertension, unspecified: Secondary | ICD-10-CM | POA: Diagnosis not present

## 2020-07-05 DIAGNOSIS — E11649 Type 2 diabetes mellitus with hypoglycemia without coma: Secondary | ICD-10-CM | POA: Diagnosis not present

## 2020-07-05 DIAGNOSIS — G9341 Metabolic encephalopathy: Secondary | ICD-10-CM | POA: Diagnosis not present

## 2020-07-05 DIAGNOSIS — R479 Unspecified speech disturbances: Secondary | ICD-10-CM | POA: Diagnosis not present

## 2020-07-05 DIAGNOSIS — I2510 Atherosclerotic heart disease of native coronary artery without angina pectoris: Secondary | ICD-10-CM | POA: Diagnosis not present

## 2020-07-05 DIAGNOSIS — E1142 Type 2 diabetes mellitus with diabetic polyneuropathy: Secondary | ICD-10-CM | POA: Diagnosis not present

## 2020-07-05 DIAGNOSIS — I2782 Chronic pulmonary embolism: Secondary | ICD-10-CM | POA: Diagnosis not present

## 2020-07-05 DIAGNOSIS — N3001 Acute cystitis with hematuria: Secondary | ICD-10-CM | POA: Diagnosis not present

## 2020-07-05 DIAGNOSIS — I10 Essential (primary) hypertension: Secondary | ICD-10-CM | POA: Diagnosis not present

## 2020-07-05 DIAGNOSIS — E162 Hypoglycemia, unspecified: Secondary | ICD-10-CM | POA: Diagnosis not present

## 2020-07-05 DIAGNOSIS — E161 Other hypoglycemia: Secondary | ICD-10-CM | POA: Diagnosis not present

## 2020-07-05 DIAGNOSIS — R4182 Altered mental status, unspecified: Secondary | ICD-10-CM | POA: Diagnosis not present

## 2020-07-05 DIAGNOSIS — Z743 Need for continuous supervision: Secondary | ICD-10-CM | POA: Diagnosis not present

## 2020-07-05 DIAGNOSIS — R531 Weakness: Secondary | ICD-10-CM | POA: Diagnosis not present

## 2020-07-05 DIAGNOSIS — R6889 Other general symptoms and signs: Secondary | ICD-10-CM | POA: Diagnosis not present

## 2020-07-06 DIAGNOSIS — N3001 Acute cystitis with hematuria: Secondary | ICD-10-CM | POA: Diagnosis not present

## 2020-07-07 DIAGNOSIS — N3001 Acute cystitis with hematuria: Secondary | ICD-10-CM | POA: Diagnosis not present

## 2020-07-07 DIAGNOSIS — I2782 Chronic pulmonary embolism: Secondary | ICD-10-CM | POA: Diagnosis not present

## 2020-07-07 DIAGNOSIS — E1142 Type 2 diabetes mellitus with diabetic polyneuropathy: Secondary | ICD-10-CM | POA: Diagnosis not present

## 2020-07-07 DIAGNOSIS — I10 Essential (primary) hypertension: Secondary | ICD-10-CM | POA: Diagnosis not present

## 2020-07-07 DIAGNOSIS — E162 Hypoglycemia, unspecified: Secondary | ICD-10-CM | POA: Diagnosis not present

## 2020-07-07 DIAGNOSIS — I2510 Atherosclerotic heart disease of native coronary artery without angina pectoris: Secondary | ICD-10-CM | POA: Diagnosis not present

## 2020-07-07 DIAGNOSIS — I272 Pulmonary hypertension, unspecified: Secondary | ICD-10-CM | POA: Diagnosis not present

## 2020-07-07 DIAGNOSIS — G9341 Metabolic encephalopathy: Secondary | ICD-10-CM | POA: Diagnosis not present

## 2020-07-14 DIAGNOSIS — Z955 Presence of coronary angioplasty implant and graft: Secondary | ICD-10-CM | POA: Diagnosis not present

## 2020-07-14 DIAGNOSIS — Z7984 Long term (current) use of oral hypoglycemic drugs: Secondary | ICD-10-CM | POA: Diagnosis not present

## 2020-07-14 DIAGNOSIS — Z7982 Long term (current) use of aspirin: Secondary | ICD-10-CM | POA: Diagnosis not present

## 2020-07-14 DIAGNOSIS — I2510 Atherosclerotic heart disease of native coronary artery without angina pectoris: Secondary | ICD-10-CM | POA: Diagnosis not present

## 2020-07-14 DIAGNOSIS — E785 Hyperlipidemia, unspecified: Secondary | ICD-10-CM | POA: Diagnosis not present

## 2020-07-14 DIAGNOSIS — M542 Cervicalgia: Secondary | ICD-10-CM | POA: Diagnosis not present

## 2020-07-14 DIAGNOSIS — R32 Unspecified urinary incontinence: Secondary | ICD-10-CM | POA: Diagnosis not present

## 2020-07-14 DIAGNOSIS — I272 Pulmonary hypertension, unspecified: Secondary | ICD-10-CM | POA: Diagnosis not present

## 2020-07-14 DIAGNOSIS — E1142 Type 2 diabetes mellitus with diabetic polyneuropathy: Secondary | ICD-10-CM | POA: Diagnosis not present

## 2020-07-14 DIAGNOSIS — I10 Essential (primary) hypertension: Secondary | ICD-10-CM | POA: Diagnosis not present

## 2020-07-15 DIAGNOSIS — E162 Hypoglycemia, unspecified: Secondary | ICD-10-CM | POA: Diagnosis not present

## 2020-07-15 DIAGNOSIS — N183 Chronic kidney disease, stage 3 unspecified: Secondary | ICD-10-CM | POA: Diagnosis not present

## 2020-07-15 DIAGNOSIS — E1122 Type 2 diabetes mellitus with diabetic chronic kidney disease: Secondary | ICD-10-CM | POA: Diagnosis not present

## 2020-07-15 DIAGNOSIS — I10 Essential (primary) hypertension: Secondary | ICD-10-CM | POA: Diagnosis not present

## 2020-07-16 DIAGNOSIS — M542 Cervicalgia: Secondary | ICD-10-CM | POA: Diagnosis not present

## 2020-07-16 DIAGNOSIS — E785 Hyperlipidemia, unspecified: Secondary | ICD-10-CM | POA: Diagnosis not present

## 2020-07-16 DIAGNOSIS — I272 Pulmonary hypertension, unspecified: Secondary | ICD-10-CM | POA: Diagnosis not present

## 2020-07-16 DIAGNOSIS — I10 Essential (primary) hypertension: Secondary | ICD-10-CM | POA: Diagnosis not present

## 2020-07-16 DIAGNOSIS — I2510 Atherosclerotic heart disease of native coronary artery without angina pectoris: Secondary | ICD-10-CM | POA: Diagnosis not present

## 2020-07-16 DIAGNOSIS — R32 Unspecified urinary incontinence: Secondary | ICD-10-CM | POA: Diagnosis not present

## 2020-07-16 DIAGNOSIS — Z7984 Long term (current) use of oral hypoglycemic drugs: Secondary | ICD-10-CM | POA: Diagnosis not present

## 2020-07-16 DIAGNOSIS — Z7982 Long term (current) use of aspirin: Secondary | ICD-10-CM | POA: Diagnosis not present

## 2020-07-16 DIAGNOSIS — Z955 Presence of coronary angioplasty implant and graft: Secondary | ICD-10-CM | POA: Diagnosis not present

## 2020-07-16 DIAGNOSIS — E1142 Type 2 diabetes mellitus with diabetic polyneuropathy: Secondary | ICD-10-CM | POA: Diagnosis not present

## 2024-06-17 DEATH — deceased
